# Patient Record
Sex: Female | Born: 1990
Health system: Southern US, Community
[De-identification: ages and names within clinical notes are randomized; demographics above are authoritative.]

## PROBLEM LIST (undated history)

## (undated) DIAGNOSIS — N390 Urinary tract infection, site not specified: Secondary | ICD-10-CM

## (undated) HISTORY — PX: NO PAST SURGERIES: SHX2092

---
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
## 2010-11-27 ENCOUNTER — Encounter: Payer: Self-pay | Admitting: Obstetrics & Gynecology

## 2010-12-27 IMAGING — US US OB DETAIL+14 WK
1 series · 14 of 28 positions shown · non-contrast
Comparison: none

OBSTETRICAL ULTRASOUND:
 This ultrasound was performed in The [HOSPITAL], and the AS OB/GYN report will be stored to [REDACTED] PACS.

[Series 1: us ob detail+14 wk · 0.24mm/px · 73 acquisitions, 14 frames shown]
[im 3/73]
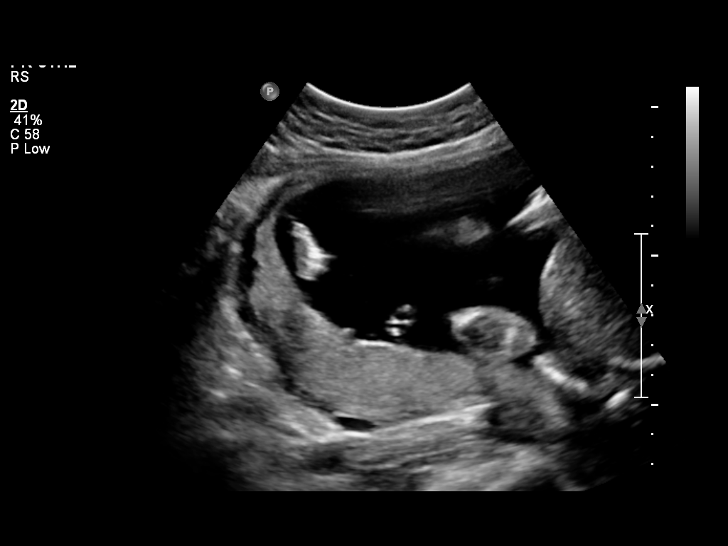
[im 9/73]
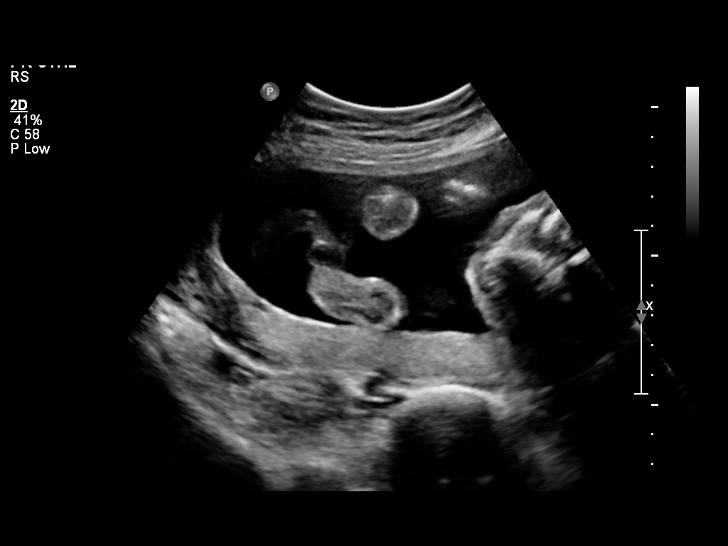
[im 14/73]
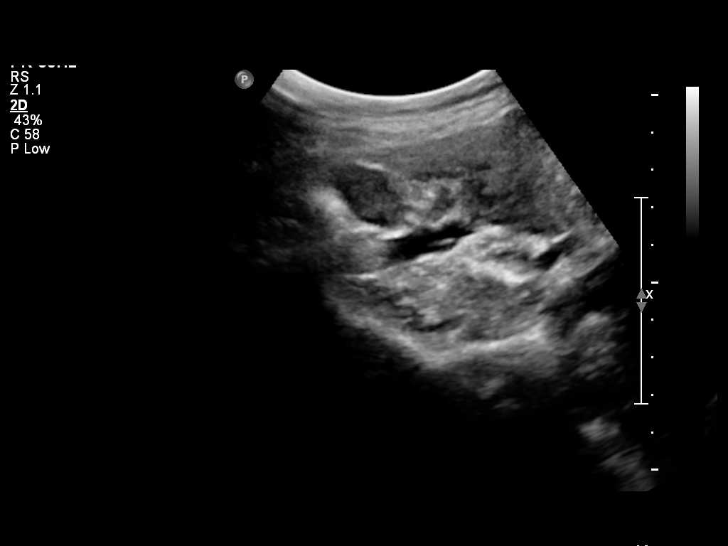
[im 19/73]
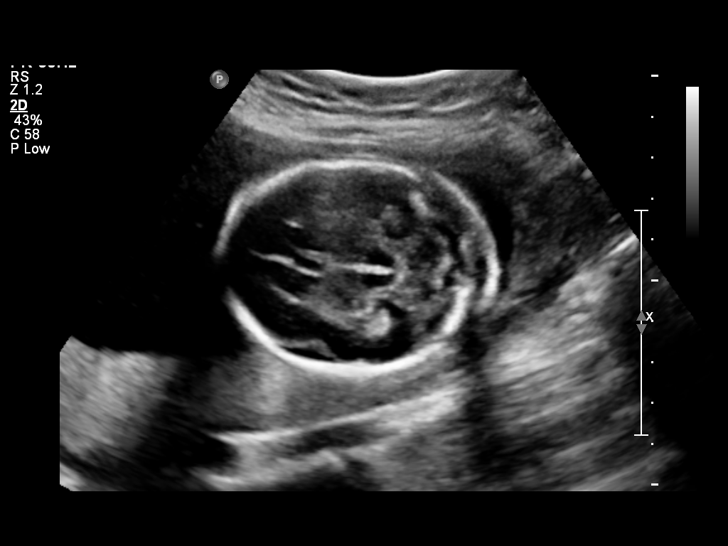
[im 25/73]
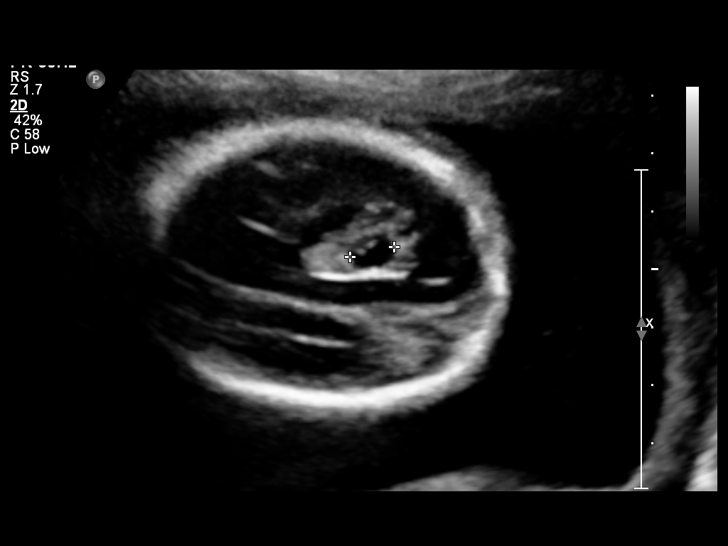
[im 30/73]
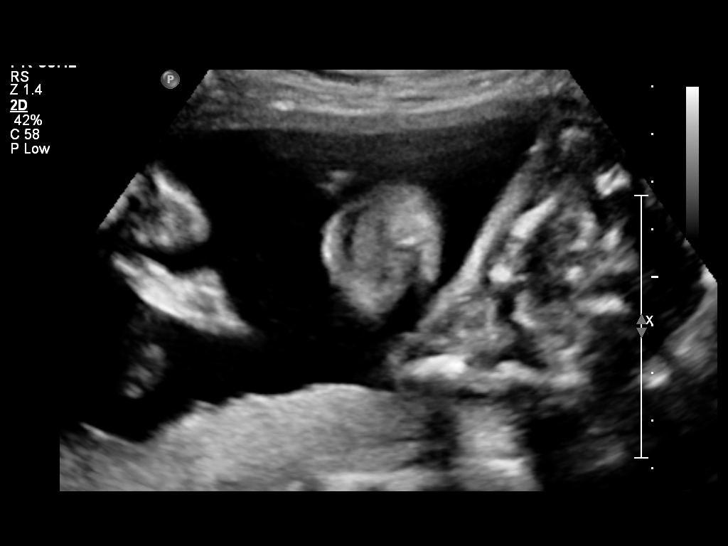
[im 35/73]
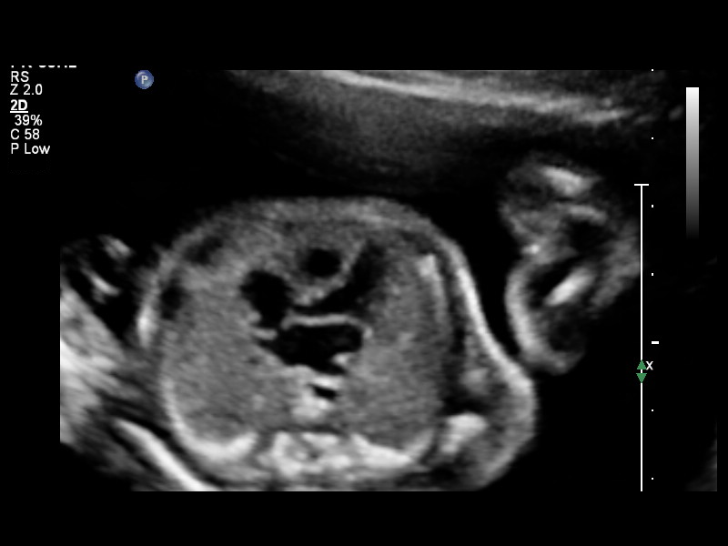
[im 41/73]
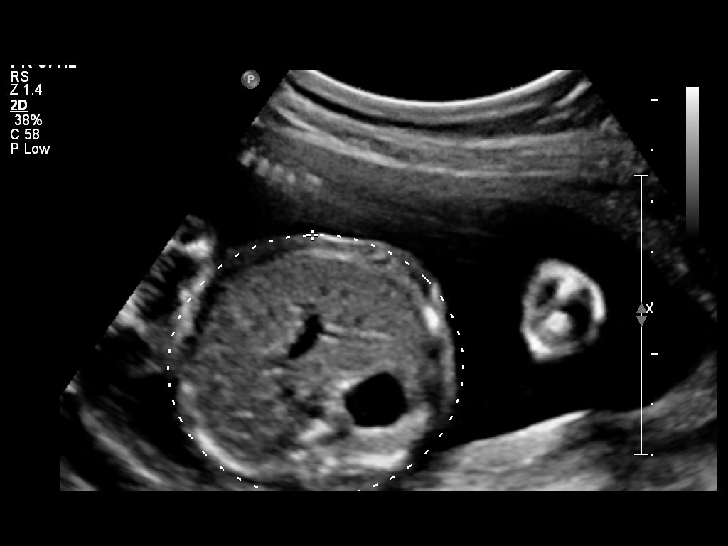
[im 46/73]
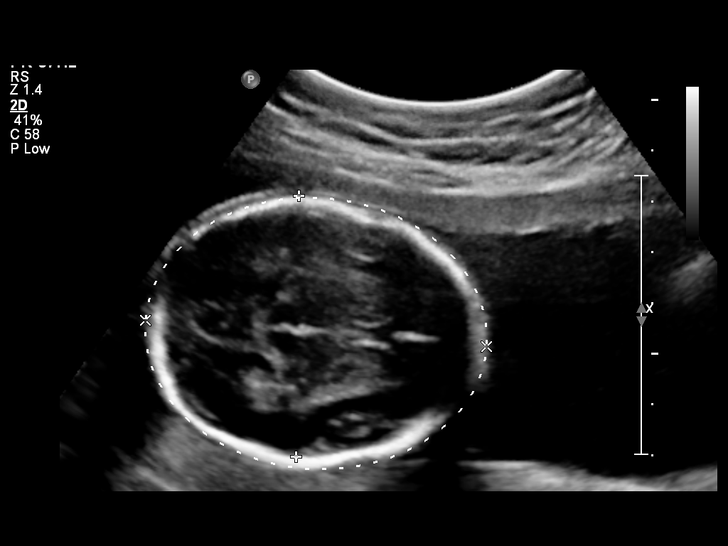
[im 51/73]
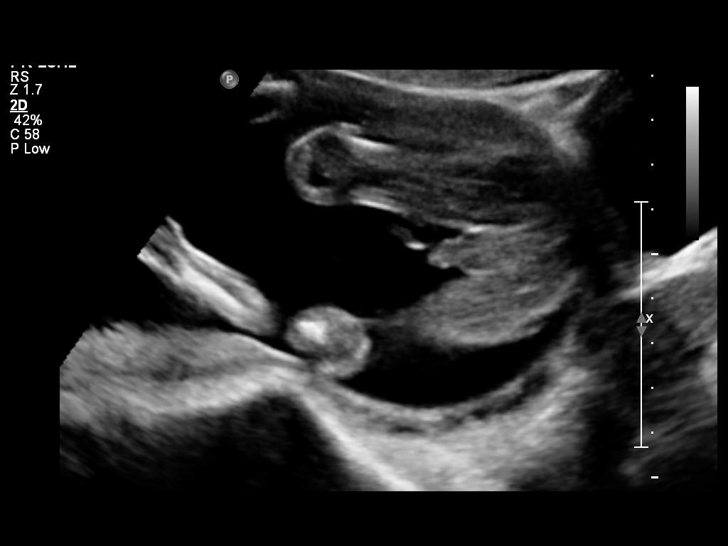
[im 57/73]
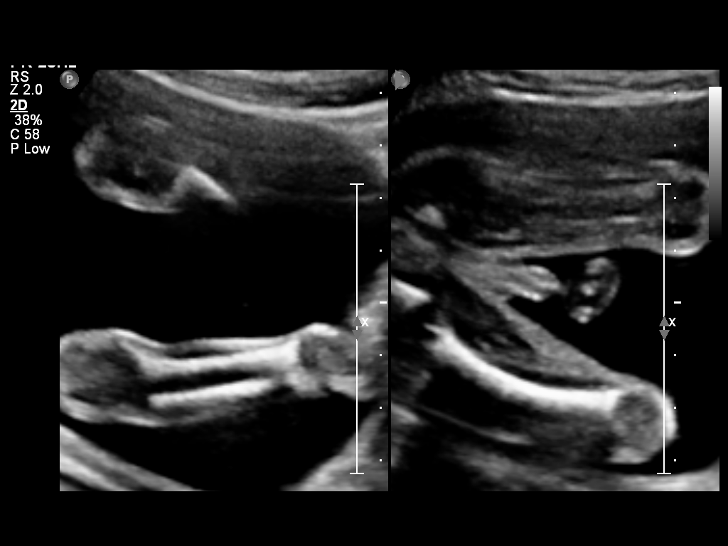
[im 62/73]
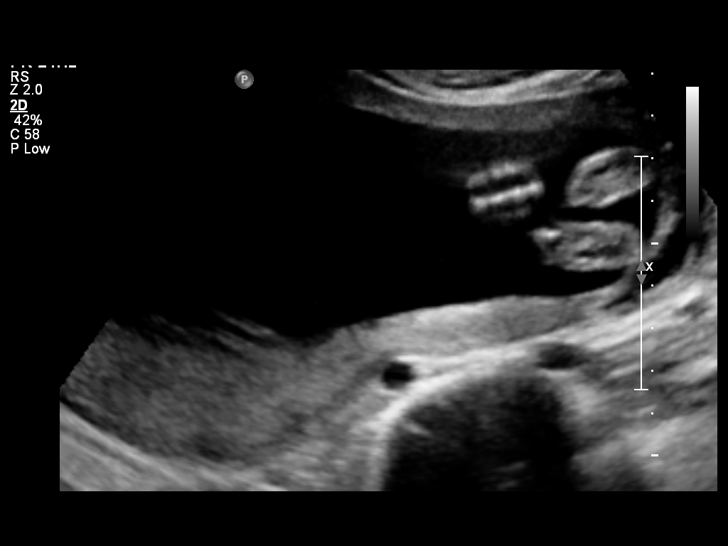
[im 67/73]
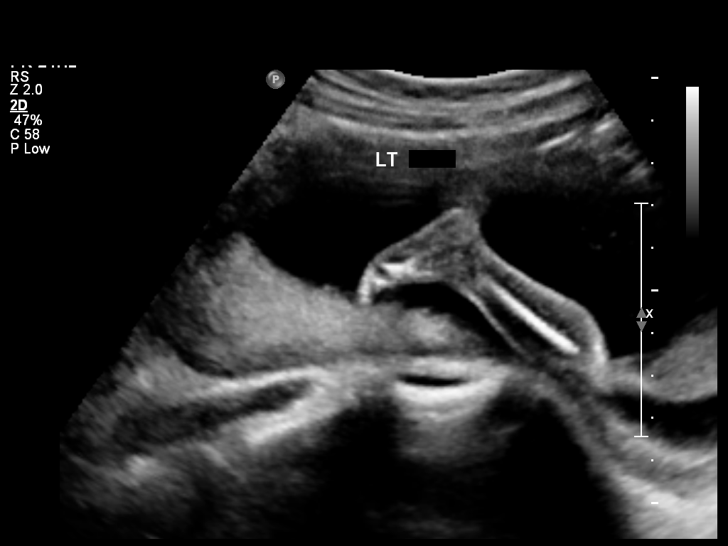
[im 73/73]
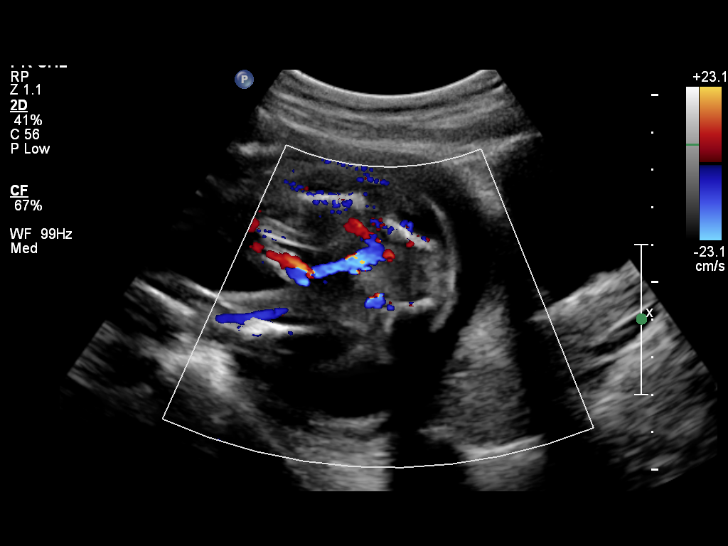

[14 of 28 positions shown; findings below may reference images not displayed]

IMPRESSION: AS OB/GYN has also been faxed to the ordering physician.

## 2011-01-15 IMAGING — US US OB FOLLOW-UP
1 series · 14 of 28 positions shown · non-contrast
Comparison: none

OBSTETRICAL ULTRASOUND:
 This ultrasound exam was performed in the [HOSPITAL] Ultrasound Department.  The OB US report was generated in the AS system, and faxed to the ordering physician.  This report is also available in [HOSPITAL]?s [employer] and in [REDACTED] PACS.

[Series 1: us ob follow up · 0.30mm/px · 35 acquisitions, 14 frames shown]
[im 2/35]
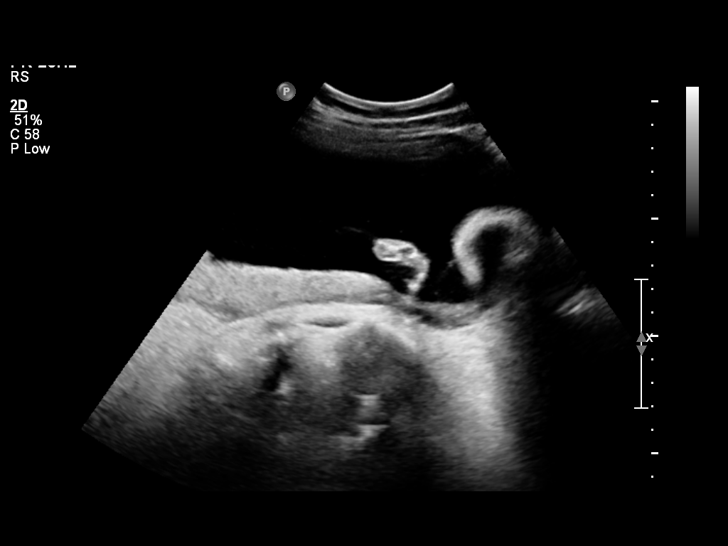
[im 4/35]
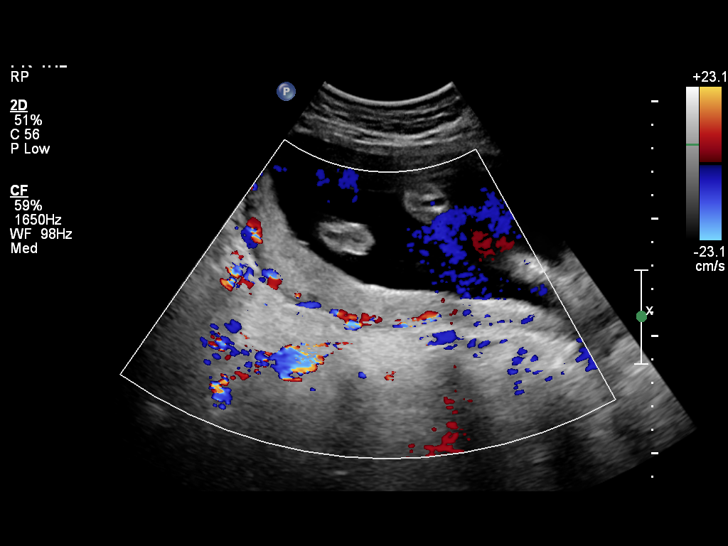
[im 7/35]
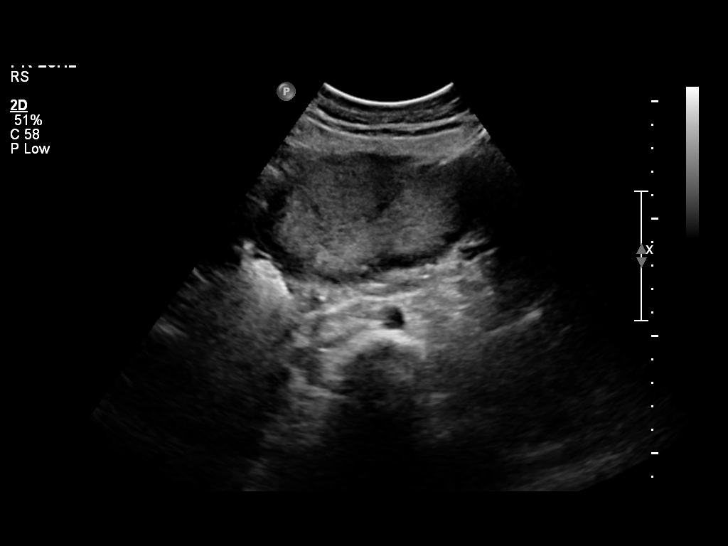
[im 9/35]
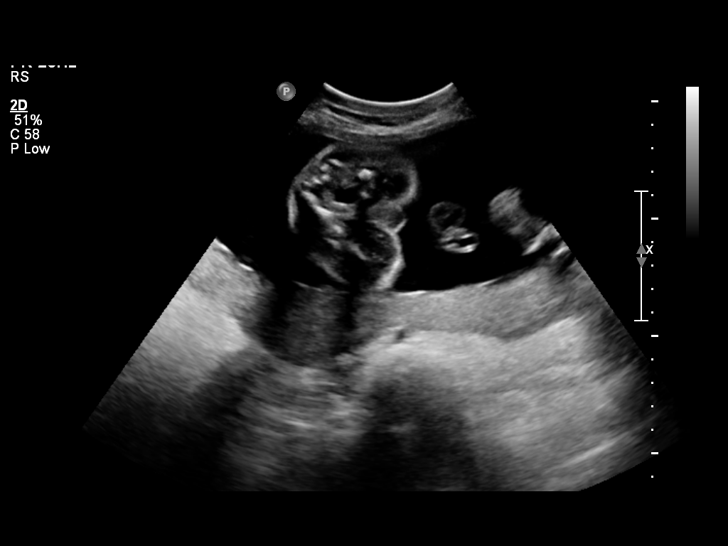
[im 12/35]
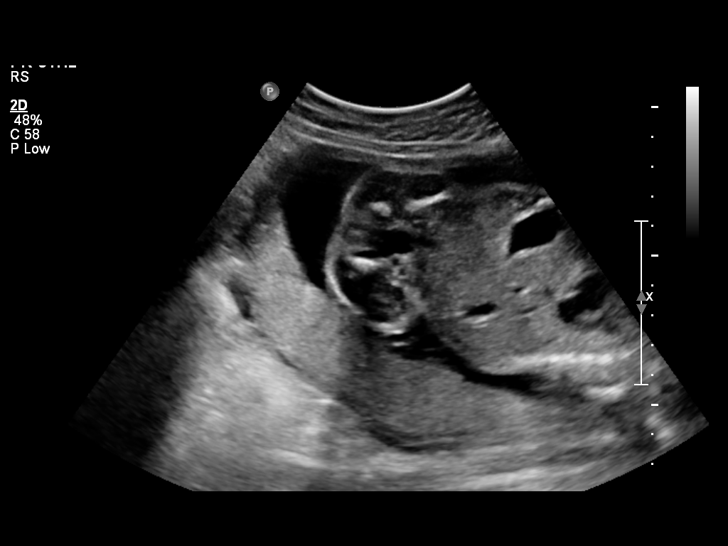
[im 14/35]
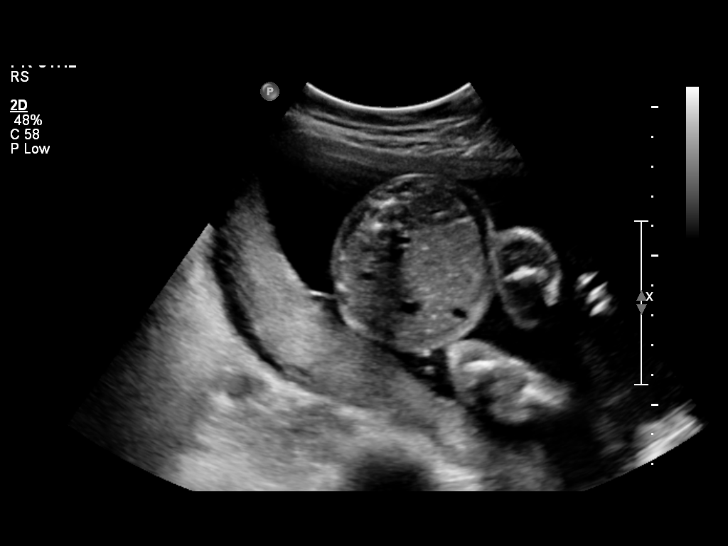
[im 17/35]
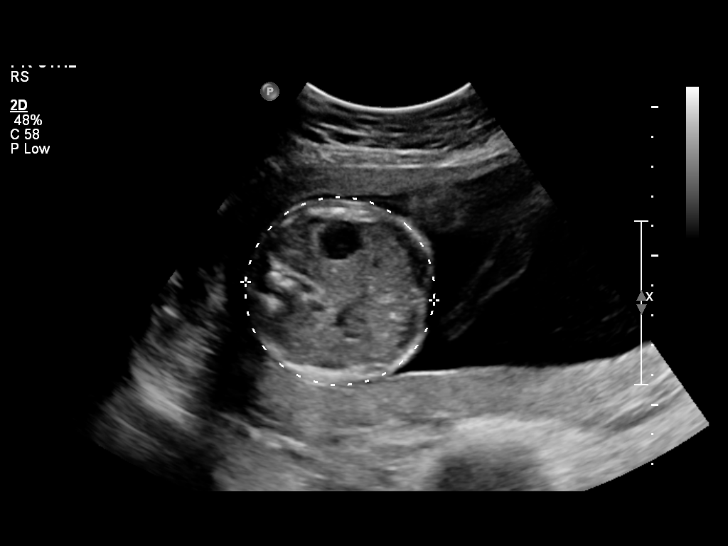
[im 19/35]
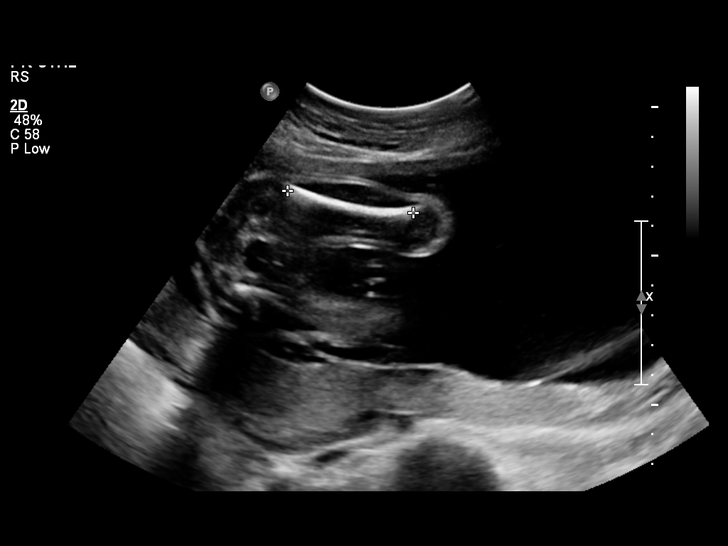
[im 22/35]
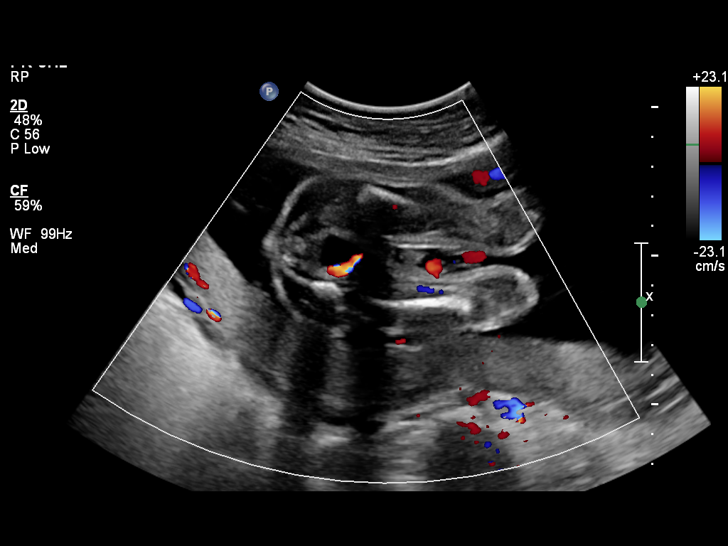
[im 24/35]
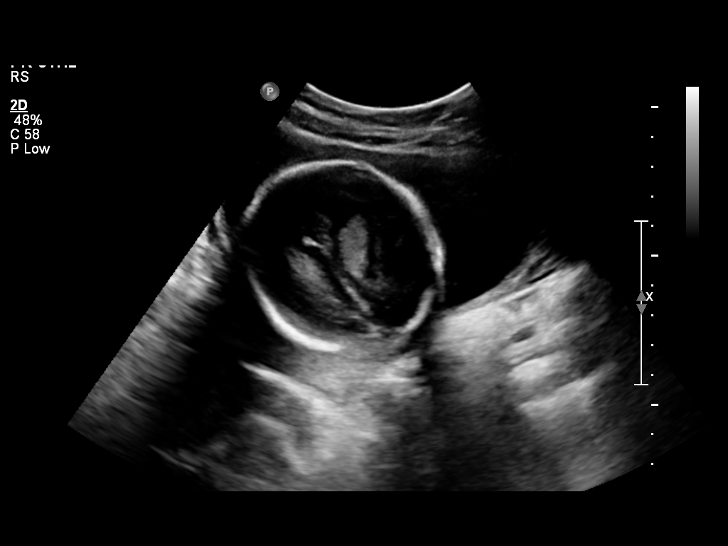
[im 27/35]
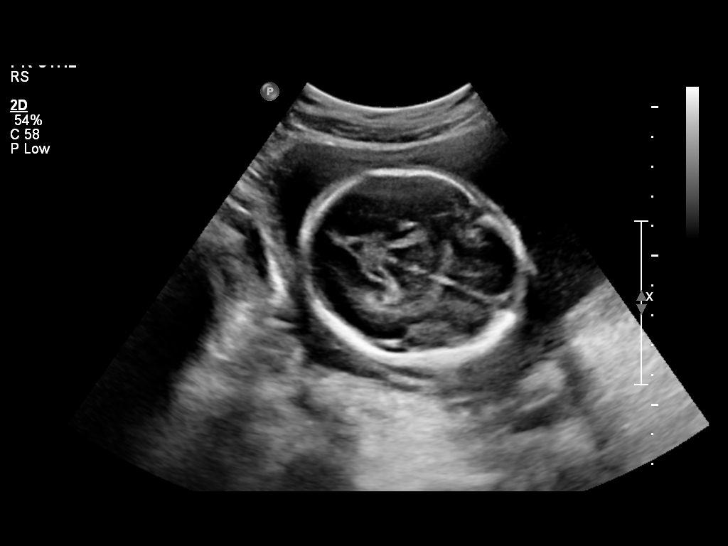
[im 29/35]
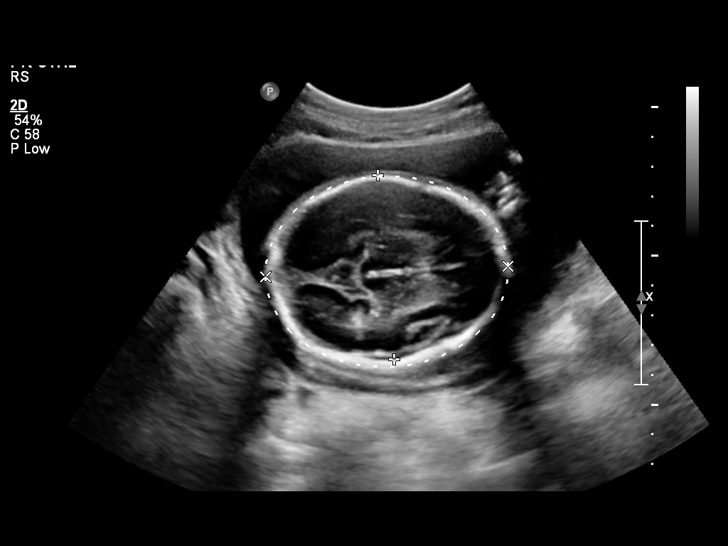
[im 32/35]
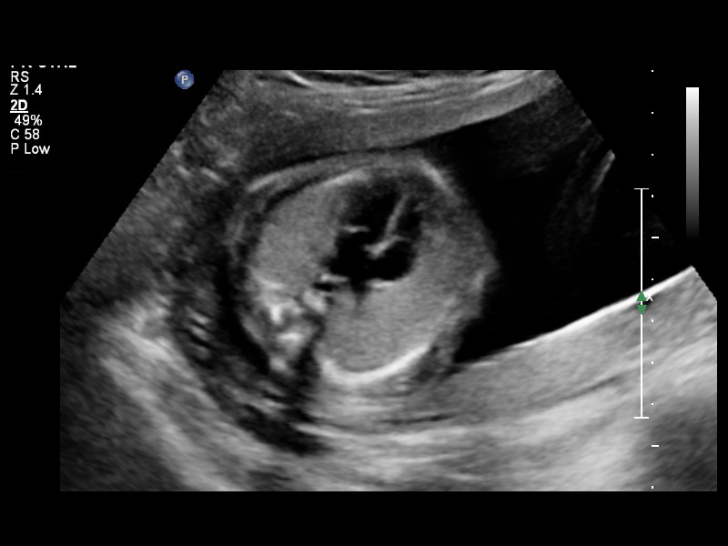
[im 35/35]
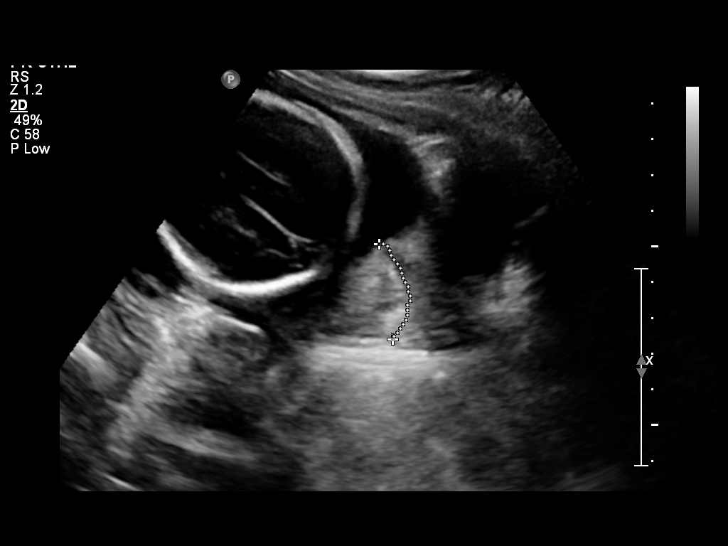

[14 of 28 positions shown; findings below may reference images not displayed]

IMPRESSION: See AS Obstetric US report.

## 2011-01-22 LAB — CBC
Hemoglobin: 11.3 g/dL — ABNORMAL LOW (ref 12.0–15.0)
Hemoglobin: 9.9 g/dL — ABNORMAL LOW (ref 12.0–15.0)
MCHC: 33.5 g/dL (ref 30.0–36.0)
MCV: 84.1 fL (ref 78.0–100.0)
MCV: 84.1 fL (ref 78.0–100.0)
MCV: 84.6 fL (ref 78.0–100.0)
Platelets: 132 10*3/uL — ABNORMAL LOW (ref 150–400)
RBC: 3.39 MIL/uL — ABNORMAL LOW (ref 3.87–5.11)
RBC: 3.51 MIL/uL — ABNORMAL LOW (ref 3.87–5.11)
RDW: 14.8 % (ref 11.5–15.5)
RDW: 15 % (ref 11.5–15.5)
WBC: 13.2 10*3/uL — ABNORMAL HIGH (ref 4.0–10.5)

## 2011-02-09 LAB — URINE CULTURE: Colony Count: NO GROWTH

## 2011-02-09 LAB — URINALYSIS, ROUTINE W REFLEX MICROSCOPIC
Bilirubin Urine: NEGATIVE
Nitrite: NEGATIVE
Specific Gravity, Urine: 1.02 (ref 1.005–1.030)
Urobilinogen, UA: 0.2 mg/dL (ref 0.0–1.0)

## 2011-02-09 LAB — GC/CHLAMYDIA PROBE AMP, GENITAL: GC Probe Amp, Genital: NEGATIVE

## 2011-04-04 ENCOUNTER — Other Ambulatory Visit: Payer: Self-pay

## 2012-01-31 ENCOUNTER — Encounter (HOSPITAL_COMMUNITY): Payer: Self-pay | Admitting: Emergency Medicine

## 2012-01-31 ENCOUNTER — Emergency Department (HOSPITAL_COMMUNITY)
Admission: EM | Admit: 2012-01-31 | Discharge: 2012-02-01 | Disposition: A | Discharge status: Home Health | Payer: BC Managed Care – PPO | Attending: Emergency Medicine | Admitting: Emergency Medicine

## 2012-01-31 ENCOUNTER — Emergency Department (HOSPITAL_COMMUNITY): Payer: BC Managed Care – PPO

## 2012-01-31 DIAGNOSIS — R0602 Shortness of breath: Secondary | ICD-10-CM | POA: Insufficient documentation

## 2012-01-31 DIAGNOSIS — R059 Cough, unspecified: Secondary | ICD-10-CM | POA: Insufficient documentation

## 2012-01-31 DIAGNOSIS — R0789 Other chest pain: Secondary | ICD-10-CM | POA: Insufficient documentation

## 2012-01-31 DIAGNOSIS — R05 Cough: Secondary | ICD-10-CM | POA: Insufficient documentation

## 2012-01-31 DIAGNOSIS — Z79899 Other long term (current) drug therapy: Secondary | ICD-10-CM | POA: Insufficient documentation

## 2012-01-31 DIAGNOSIS — J45901 Unspecified asthma with (acute) exacerbation: Secondary | ICD-10-CM | POA: Insufficient documentation

## 2012-01-31 LAB — CBC
HCT: 42.8 % (ref 36.0–46.0)
Hemoglobin: 14.4 g/dL (ref 12.0–15.0)
MCH: 28.5 pg (ref 26.0–34.0)
MCHC: 33.6 g/dL (ref 30.0–36.0)
MCV: 84.6 fL (ref 78.0–100.0)
Platelets: 233 10*3/uL (ref 150–400)
RBC: 5.06 MIL/uL (ref 3.87–5.11)
RDW: 12.8 % (ref 11.5–15.5)
WBC: 8.1 10*3/uL (ref 4.0–10.5)

## 2012-01-31 LAB — DIFFERENTIAL
Basophils Absolute: 0 10*3/uL (ref 0.0–0.1)
Basophils Relative: 0 % (ref 0–1)
Eosinophils Absolute: 0 10*3/uL (ref 0.0–0.7)
Eosinophils Relative: 0 % (ref 0–5)
Lymphocytes Relative: 12 % (ref 12–46)
Lymphs Abs: 1 10*3/uL (ref 0.7–4.0)
Monocytes Absolute: 0.1 10*3/uL (ref 0.1–1.0)
Monocytes Relative: 1 % — ABNORMAL LOW (ref 3–12)
Neutro Abs: 7 10*3/uL (ref 1.7–7.7)
Neutrophils Relative %: 87 % — ABNORMAL HIGH (ref 43–77)

## 2012-01-31 LAB — POCT I-STAT, CHEM 8
Creatinine, Ser: 0.5 mg/dL (ref 0.50–1.10)
Hemoglobin: 15.6 g/dL — ABNORMAL HIGH (ref 12.0–15.0)
Potassium: 4.6 mEq/L (ref 3.5–5.1)
Sodium: 141 mEq/L (ref 135–145)

## 2012-01-31 MED ORDER — IPRATROPIUM BROMIDE 0.02 % IN SOLN
0.5000 mg | Freq: Once | RESPIRATORY_TRACT | Status: AC
  Administered 2012-01-31: 0.5 mg via RESPIRATORY_TRACT
  Filled 2012-01-31: qty 2.5

## 2012-01-31 MED ORDER — ALBUTEROL SULFATE (5 MG/ML) 0.5% IN NEBU
2.5000 mg | INHALATION_SOLUTION | Freq: Once | RESPIRATORY_TRACT | Status: AC
  Administered 2012-02-01: 2.5 mg via RESPIRATORY_TRACT
  Filled 2012-01-31: qty 0.5

## 2012-01-31 MED ORDER — SODIUM CHLORIDE 0.9 % IV SOLN: Freq: Once | INTRAVENOUS | Status: DC

## 2012-01-31 MED ORDER — ALBUTEROL SULFATE (5 MG/ML) 0.5% IN NEBU
2.5000 mg | INHALATION_SOLUTION | Freq: Once | RESPIRATORY_TRACT | Status: AC
  Administered 2012-01-31: 2.5 mg via RESPIRATORY_TRACT
  Filled 2012-01-31: qty 0.5

## 2012-01-31 NOTE — ED Provider Notes (Signed)
History     CSN: [id number]  Arrival date & time 01/31/12  1941   First MD Initiated Contact with Patient 01/31/12 2123      Chief Complaint  Patient presents with  . Asthma    (Consider location/radiation/quality/duration/timing/severity/associated sxs/prior treatment) HPI Comments: History of asthma has been short of breath.  The last month with, wheezing.  She's been using her inhalers.  She is seeing her primary care physician, who added steroids, and a Z-Pak and change her from albuterol to Combivent 3 days ago without any relief.  She does report that 2 weeks ago.  She had a long car ride to [place] where she sat for the entire length of time.  She denies leg swelling, pain  Patient is a 21 y.o. female presenting with asthma. The history is provided by the patient.  Asthma This is a chronic problem. The current episode started more than 1 month ago. The problem occurs constantly. The problem has been unchanged. Associated symptoms include chest pain and coughing. Pertinent negatives include no chills or fever.    Past Medical History  Diagnosis Date  . Asthma     History reviewed. No pertinent past surgical history.  Family History  Problem Relation Age of Onset  . Hypertension Other   . Diabetes Other     History  Substance Use Topics  . Smoking status: Former Games developer  . Smokeless tobacco: Not on file  . Alcohol Use: No    OB History    Grav Para Term Preterm Abortions TAB SAB Ect Mult Living                  Review of Systems  Constitutional: Negative for fever and chills.  HENT: Negative for rhinorrhea.   Respiratory: Positive for cough, chest tightness, shortness of breath and wheezing.   Cardiovascular: Positive for chest pain. Negative for palpitations.  Neurological: Negative for dizziness.    Allergies  Review of patient's allergies indicates no known allergies.  Home Medications   Current Outpatient Rx  Name Route Sig Dispense Refill  .  ALBUTEROL SULFATE HFA 108 (90 BASE) MCG/ACT IN AERS Inhalation Inhale 2 puffs into the lungs every 6 (six) hours as needed.    Maximino Greenland 18-103 MCG/ACT IN AERO Inhalation Inhale 2 puffs into the lungs every 6 (six) hours as needed.    . AZITHROMYCIN 250 MG PO TABS Oral Take 250 mg by mouth daily.    Marland Kitchen PREDNISONE 20 MG PO TABS Oral Take 60 mg by mouth daily.      BP 121/65  Pulse 79  Temp(Src) 97.5 F (36.4 C) (Oral)  Resp 18  SpO2 99%  LMP 01/11/2012  Physical Exam  Constitutional: She is oriented to person, place, and time. She appears well-developed and well-nourished.  HENT:  Head: Normocephalic.  Eyes: Pupils are equal, round, and reactive to light.  Neck: Normal range of motion.  Cardiovascular: Normal rate.   Pulmonary/Chest: Effort normal. No respiratory distress. She has wheezes. She exhibits no tenderness.  Musculoskeletal: Normal range of motion.  Neurological: She is alert and oriented to person, place, and time.  Skin: Skin is warm.    ED Course  Procedures (including critical care time)   Labs Reviewed  CBC  DIFFERENTIAL  D-DIMER, QUANTITATIVE   Dg Chest 2 View  01/31/2012  *RADIOLOGY REPORT*  Clinical Data: Shortness of breath and cough for 1 week; history of asthma.  CHEST - 2 VIEW  Comparison: Chest radiograph performed 08/22/2009  Findings: The lungs are well-aerated and clear.  There is no evidence of focal opacification, pleural effusion or pneumothorax.  The heart is normal in size; the mediastinal contour is within normal limits.  No acute osseous abnormalities are seen.  IMPRESSION: No acute cardiopulmonary process seen.  Original Report Authenticated By: Tonia Ghent, M.D.     No diagnosis found.  Decreased wheezing, moving more air after second albuterol treatment  MDM  Suspect this is asthma exacerbation, but will obtain d-dimer with patient's history of chest pain, persistent shortness of breath for the past month since a long car  trip        Arman Filter, NP 02/01/12 0032

## 2012-01-31 NOTE — ED Notes (Signed)
Pt states she is having pain in her chest with breathing and having random asthma attacks for no reason  Pt states she is having to use her inhaler too much  Pt went to her PCP on Monday and they gave her a different inhaler, Combivent, a zpack, and prednisone  They called the dr office today and was told to come to the hospital for a chest xray to rule out pneumonia

## 2012-02-01 MED ORDER — ALBUTEROL SULFATE (2.5 MG/3ML) 0.083% IN NEBU: 2.5000 mg | INHALATION_SOLUTION | Freq: Four times a day (QID) | RESPIRATORY_TRACT | Status: DC | PRN

## 2012-02-01 NOTE — ED Provider Notes (Signed)
Medical screening examination/treatment/procedure(s) were performed by non-physician practitioner and as supervising physician I was immediately available for consultation/collaboration.    Conley Pawling L Clerance Umland, MD 02/01/12 2348 

## 2012-02-01 NOTE — Discharge Instructions (Signed)
Asthma, Adult Asthma is caused by narrowing of the air passages in the lungs. It may be triggered by pollen, dust, animal dander, molds, some foods, respiratory infections, exposure to smoke, exercise, emotional stress or other allergens (things that cause allergic reactions or allergies). Repeat attacks are common. HOME CARE INSTRUCTIONS   Use prescription medications as ordered by your caregiver.   Avoid pollen, dust, animal dander, molds, smoke and other things that cause attacks at home and at work.   You may have fewer attacks if you decrease dust in your home. Electrostatic air cleaners may help.   It may help to replace your pillows or mattress with materials less likely to cause allergies.   Talk to your caregiver about an action plan for managing asthma attacks at home, including, the use of a peak flow meter which measures the severity of your asthma attack. An action plan can help minimize or stop the attack without having to seek medical care.   If you are not on a fluid restriction, drink 8 to 10 glasses of water each day.   Always have a plan prepared for seeking medical attention, including, calling your physician, accessing local emergency care, and calling 911 (in the [place] for a severe attack.   Discuss possible exercise routines with your caregiver.   If animal dander is the cause of asthma, you may need to get rid of pets.  SEEK MEDICAL CARE IF:   You have wheezing and shortness of breath even if taking medicine to prevent attacks.   You have muscle aches, chest pain or thickening of sputum.   Your sputum changes from clear or white to yellow, green, gray, or bloody.   You have any problems that may be related to the medicine you are taking (such as a rash, itching, swelling or trouble breathing).  SEEK IMMEDIATE MEDICAL CARE IF:   Your usual medicines do not stop your wheezing or there is increased coughing and/or shortness of breath.   You have increased  difficulty breathing.   You have a fever.  MAKE SURE YOU:   Understand these instructions.   Will watch your condition.   Will get help right away if you are not doing well or get worse.  Document Released: 10/23/2005 Document Revised: 10/12/2011 Document Reviewed: 06/10/2008 [facility] Patient Information 2012 [place], [place]. As discussed there is no pneumonia, and chest x-ray tonight.  Also, please use your albuterol nebulizer in your home machine every 6 hours for the next 2 days while awake as well as her Combivent any steroids in your azithromycin and then use as needed

## 2012-02-22 ENCOUNTER — Other Ambulatory Visit: Payer: Self-pay | Admitting: Family Medicine

## 2012-02-22 DIAGNOSIS — N63 Unspecified lump in unspecified breast: Secondary | ICD-10-CM

## 2012-02-26 ENCOUNTER — Ambulatory Visit
Admission: RE | Admit: 2012-02-26 | Discharge: 2012-02-26 | Disposition: A | Discharge status: Home / Self Care | Payer: BC Managed Care – PPO | Source: Ambulatory Visit | Attending: Family Medicine | Admitting: Family Medicine

## 2012-02-26 DIAGNOSIS — N63 Unspecified lump in unspecified breast: Secondary | ICD-10-CM

## 2012-06-04 LAB — OB RESULTS CONSOLE HIV ANTIBODY (ROUTINE TESTING): HIV: NONREACTIVE

## 2012-06-04 LAB — OB RESULTS CONSOLE RUBELLA ANTIBODY, IGM: Rubella: IMMUNE

## 2012-06-04 LAB — OB RESULTS CONSOLE HEPATITIS B SURFACE ANTIGEN: Hepatitis B Surface Ag: NEGATIVE

## 2012-06-04 LAB — OB RESULTS CONSOLE RPR: RPR: NONREACTIVE

## 2012-10-30 ENCOUNTER — Encounter (HOSPITAL_COMMUNITY): Payer: Self-pay | Admitting: Nurse Practitioner

## 2012-10-30 ENCOUNTER — Emergency Department (HOSPITAL_COMMUNITY)
Admission: EM | Admit: 2012-10-30 | Discharge: 2012-10-30 | Disposition: A | Discharge status: Home / Self Care | Payer: Medicaid Other | Attending: Emergency Medicine | Admitting: Emergency Medicine

## 2012-10-30 DIAGNOSIS — Z79899 Other long term (current) drug therapy: Secondary | ICD-10-CM | POA: Insufficient documentation

## 2012-10-30 DIAGNOSIS — R109 Unspecified abdominal pain: Secondary | ICD-10-CM | POA: Insufficient documentation

## 2012-10-30 DIAGNOSIS — Z87891 Personal history of nicotine dependence: Secondary | ICD-10-CM | POA: Insufficient documentation

## 2012-10-30 DIAGNOSIS — R141 Gas pain: Secondary | ICD-10-CM | POA: Insufficient documentation

## 2012-10-30 DIAGNOSIS — J45909 Unspecified asthma, uncomplicated: Secondary | ICD-10-CM | POA: Insufficient documentation

## 2012-10-30 DIAGNOSIS — R1110 Vomiting, unspecified: Secondary | ICD-10-CM

## 2012-10-30 DIAGNOSIS — O212 Late vomiting of pregnancy: Secondary | ICD-10-CM | POA: Insufficient documentation

## 2012-10-30 DIAGNOSIS — R142 Eructation: Secondary | ICD-10-CM | POA: Insufficient documentation

## 2012-10-30 DIAGNOSIS — R197 Diarrhea, unspecified: Secondary | ICD-10-CM | POA: Insufficient documentation

## 2012-10-30 MED ORDER — ONDANSETRON HCL 4 MG/2ML IJ SOLN
4.0000 mg | Freq: Once | INTRAMUSCULAR | Status: AC
  Administered 2012-10-30: 4 mg via INTRAVENOUS
  Filled 2012-10-30: qty 2

## 2012-10-30 MED ORDER — SODIUM CHLORIDE 0.9 % IV BOLUS (SEPSIS)
1000.0000 mL | Freq: Once | INTRAVENOUS | Status: AC
  Administered 2012-10-30: 1000 mL via INTRAVENOUS

## 2012-10-30 MED ORDER — ONDANSETRON HCL 4 MG PO TABS: 8.0000 mg | ORAL_TABLET | Freq: Three times a day (TID) | ORAL | Status: DC | PRN

## 2012-10-30 MED ORDER — SODIUM CHLORIDE 0.9 % IV SOLN
Freq: Once | INTRAVENOUS | Status: AC
  Administered 2012-10-30: 02:00:00 via INTRAVENOUS

## 2012-10-30 MED ORDER — ONDANSETRON HCL 4 MG/2ML IJ SOLN: 4.0000 mg | Freq: Once | INTRAMUSCULAR | Status: DC

## 2012-10-30 NOTE — ED Provider Notes (Signed)
History     CSN: [id number]  Arrival date & time 10/30/12  0052   First MD Initiated Contact with Patient 10/30/12 0105      Chief Complaint  Patient presents with  . Emesis During Pregnancy    (Consider location/radiation/quality/duration/timing/severity/associated sxs/prior treatment) HPI Patient developed abdominal cramping at the lower abdomen felt with possible labor pains onset 11 PM tonight accompanied by one episode of vomiting and 2 episodes of diarrhea. No treatment prior to coming here. Presently feels mild vague lower abdominal discomfort. She denies gush of fluid between her legs. No treatment prior to coming here nothing makes symptoms better or worse  Past Medical History  Diagnosis Date  . Asthma     No past surgical history on file.  Family History  Problem Relation Age of Onset  . Hypertension Other   . Diabetes Other     History  Substance Use Topics  . Smoking status: Former Games developer  . Smokeless tobacco: Not on file  . Alcohol Use: No    OB History    Grav Para Term Preterm Abortions TAB SAB Ect Mult Living                  Review of Systems  Constitutional: Negative.   HENT: Negative.   Respiratory: Negative.   Cardiovascular: Negative.   Gastrointestinal: Positive for nausea, vomiting, abdominal pain, diarrhea and abdominal distention.  Genitourinary:       [redacted] weeks pregnant  Musculoskeletal: Negative.   Skin: Negative.   Neurological: Negative.   Hematological: Negative.   Psychiatric/Behavioral: Negative.   All other systems reviewed and are negative.    Allergies  Review of patient's allergies indicates no known allergies.  Home Medications   Current Outpatient Rx  Name  Route  Sig  Dispense  Refill  . ALBUTEROL SULFATE HFA 108 (90 BASE) MCG/ACT IN AERS   Inhalation   Inhale 2 puffs into the lungs every 6 (six) hours as needed.         . ALBUTEROL SULFATE (2.5 MG/3ML) 0.083% IN NEBU   Nebulization   Take 3 mLs (2.5 mg  total) by nebulization every 6 (six) hours as needed for wheezing.   75 mL   12   . IPRATROPIUM-ALBUTEROL 18-103 MCG/ACT IN AERO   Inhalation   Inhale 2 puffs into the lungs every 6 (six) hours as needed.         . AZITHROMYCIN 250 MG PO TABS   Oral   Take 250 mg by mouth daily.         Marland Kitchen PREDNISONE 20 MG PO TABS   Oral   Take 60 mg by mouth daily.           BP 123/65  Temp 98.2 F (36.8 C) (Oral)  Resp 16  SpO2 100%  Physical Exam  Nursing note and vitals reviewed. Constitutional: She appears well-developed and well-nourished.  HENT:  Head: Normocephalic and atraumatic.  Eyes: Conjunctivae normal are normal. Pupils are equal, round, and reactive to light.  Neck: Neck supple. No tracheal deviation present. No thyromegaly present.  Cardiovascular: Normal rate and regular rhythm.   No murmur heard. Pulmonary/Chest: Effort normal and breath sounds normal.  Abdominal: Soft. Bowel sounds are normal. She exhibits no distension. There is no tenderness.       Minimal diffuse tenderness, gravd fetal heart tones 140s  Musculoskeletal: Normal range of motion. She exhibits no edema and no tenderness.  Neurological: She is alert. Coordination normal.  Skin: Skin is warm and dry. No rash noted.  Psychiatric: She has a normal mood and affect.    ED Course  Procedures (including critical care time)  to  called patient placed on fetal monitor Labs Reviewed - No data to display No results found.   No diagnosis found.   3:45 AM feels improved after treatment with intravenous fluids and Zofran. Able to drink without difficulty. No further vomiting. Patient was monitored for several hours here on the fetal monitor initially had some mild contractions which resolved after treatment with IV hydration. Dr. Arlyce Dice this consult via phone by OB rapid response nurse MDM  Plan prescription Zofran [facility] if symptoms worsen or call Dr.  Arlyce Dice Diagnosis #1 vomiting and diarrhea #2 abdominal pain #3 uterine contractions-resolved        Doug Sou, MD 10/30/12 [phone]

## 2012-10-30 NOTE — ED Notes (Addendum)
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 Pt sts having diarrhea. Pt sts feeling malaise this evening. Pt sts feeling lots of pressure in mid abdomen

## 2012-10-30 NOTE — Progress Notes (Signed)
Advised Dr Kirtland Bouchard. Ross of pt at [facility] ED, SVE 1/40/-3 soft, FHT 130, reactive, no decels, UC 3-6, 40-60, mild.  Dr Tenny Craw requested monitoring x1 hr, recheck cervix then.  If cervix unchanged and pt feeling better may discharge home.  Advised Dr. Ethelda Chick of discussion w/Dr. Tenny Craw; he is agreeable.   Clement Sayres OB Rapid Response RN

## 2012-10-30 NOTE — ED Notes (Signed)
Pt A.O. X 2. NAD. Denies pain. Verbalized understanding of discharge instructions. Verbalized understanding of indicators for the need to seek further treatment. Verbalized understanding of medication administration. No further questions at this time. Mother at bedside.

## 2012-11-06 NOTE — L&D Delivery Note (Signed)
Patient progressed rapidly, midwife standby in room as baby delivered spontaneously, pt pushed for <10 minutes with epidural.   NSVD female infant, Apgars 9/9, weight pending.   The patient had no lacerations. Fundus was firm. EBL was expected. Placenta was delivered intact. Vagina was clear.  Baby was vigorous to bedside.  Stacy Oneill

## 2012-11-11 ENCOUNTER — Other Ambulatory Visit: Payer: Self-pay

## 2012-11-11 LAB — OB RESULTS CONSOLE GBS: GBS: NEGATIVE

## 2012-11-18 ENCOUNTER — Inpatient Hospital Stay (HOSPITAL_COMMUNITY)
Admission: AD | Admit: 2012-11-18 | Discharge: 2012-11-18 | Disposition: A | Discharge status: Hospice | Payer: Medicaid Other | Source: Ambulatory Visit | Attending: Obstetrics and Gynecology | Admitting: Obstetrics and Gynecology

## 2012-11-18 ENCOUNTER — Encounter (HOSPITAL_COMMUNITY): Payer: Self-pay | Admitting: *Deleted

## 2012-11-18 DIAGNOSIS — R002 Palpitations: Secondary | ICD-10-CM

## 2012-11-18 DIAGNOSIS — D689 Coagulation defect, unspecified: Secondary | ICD-10-CM | POA: Insufficient documentation

## 2012-11-18 DIAGNOSIS — D509 Iron deficiency anemia, unspecified: Secondary | ICD-10-CM

## 2012-11-18 DIAGNOSIS — J45909 Unspecified asthma, uncomplicated: Secondary | ICD-10-CM

## 2012-11-18 DIAGNOSIS — O99019 Anemia complicating pregnancy, unspecified trimester: Secondary | ICD-10-CM | POA: Insufficient documentation

## 2012-11-18 DIAGNOSIS — O99891 Other specified diseases and conditions complicating pregnancy: Secondary | ICD-10-CM | POA: Insufficient documentation

## 2012-11-18 DIAGNOSIS — D696 Thrombocytopenia, unspecified: Secondary | ICD-10-CM | POA: Insufficient documentation

## 2012-11-18 HISTORY — DX: Urinary tract infection, site not specified: N39.0

## 2012-11-18 LAB — BASIC METABOLIC PANEL
CO2: 23 mEq/L (ref 19–32)
Chloride: 100 mEq/L (ref 96–112)
Glucose, Bld: 81 mg/dL (ref 70–99)
Potassium: 3.6 mEq/L (ref 3.5–5.1)
Sodium: 133 mEq/L — ABNORMAL LOW (ref 135–145)

## 2012-11-18 LAB — CBC
Hemoglobin: 10.2 g/dL — ABNORMAL LOW (ref 12.0–15.0)
MCH: 27.1 pg (ref 26.0–34.0)
RBC: 3.77 MIL/uL — ABNORMAL LOW (ref 3.87–5.11)
WBC: 9 10*3/uL (ref 4.0–10.5)

## 2012-11-18 NOTE — MAU Note (Signed)
Pt sent from office, - irregular heartbeat(her's NOT the baby). No hx.  No complaints.

## 2012-11-18 NOTE — MAU Provider Note (Signed)
Chief Complaint:  Irregular Heart Beat   First Provider Initiated Contact with Patient 11/18/12 1636      HPI: Stacy Oneill is a 22 y.o. G2P1001 at [redacted]w[redacted]d who is sent by Stacy Oneill to maternity admissions for further evaluation after her routine PN visit. Reports onset last night of sensation of heart pounding and feeling SOB and a little lightheaded. It lasted several  seconds, but has continued today every few minutes. Sometimes feels like heart is beating fast, but not fluttering or pounding in neck. Unaware of ever skipping beats. Uses albuterol daily (including yesterday and today) same as she always has. Caffeine use: husband says 3 sodas a day, pt says not so much. Denies contractions, leakage of fluid or vaginal bleeding. Good fetal movement.   ROS negative for CP, persistent dizziness, diaphoresis, illicit drugs, anxiety  Pregnancy Course: significant for mild anemia, chronic asthma. PMD Stacy Oneill  Past Medical History: Past Medical History  Diagnosis Date  . Asthma   . Urinary tract infection   Neg hx syncope, palpitations, CP, exercise intolerance  Past obstetric history: OB History    Grav Para Term Preterm Abortions TAB SAB Ect Mult Living   2 1 1       1      # Outc Date GA Lbr Len/2nd Wgt Sex Del Anes PTL Lv   1 TRM     M SVD EPI No Yes   2 CUR               Past Surgical History: Past Surgical History  Procedure Date  . No past surgeries     Family History: Family History  Problem Relation Age of Onset  . Hypertension Other   . Diabetes Other   . Other Neg Hx     Social History: History  Substance Use Topics  . Smoking status: Former Smoker    Types: Cigarettes  . Smokeless tobacco: Never Used     Comment: quit prior to preg  . Alcohol Use: No    Allergies: No Known Allergies  Meds:  Prescriptions prior to admission  Medication Sig Dispense Refill  . albuterol (PROVENTIL HFA;VENTOLIN HFA) 108 (90 BASE) MCG/ACT inhaler Inhale 2 puffs  into the lungs every 6 (six) hours as needed. For shortness of breath      . albuterol (PROVENTIL) (2.5 MG/3ML) 0.083% nebulizer solution Take 3 mLs (2.5 mg total) by nebulization every 6 (six) hours as needed for wheezing.  75 mL  12  . ondansetron (ZOFRAN) 4 MG tablet Take 2 tablets (8 mg total) by mouth every 8 (eight) hours as needed for nausea.  16 tablet  0    ROS: Pertinent findings in history of present illness.  Physical Exam  Blood pressure 111/67, pulse 95, temperature 98.3 F (36.8 C), temperature source Oral, resp. rate 18, last menstrual period 01/11/2012. GENERAL: Well-developed, well-nourished female in no acute distress.  HEENT: normocephalic HEART:RRR with gr 1 sytolic murmur @ LSB, no gallop or rub heard RESP: normal effort, CTA bilaterally ABDOMEN: Soft, non-tender, gravid appropriate for gestational age EXTREMITIES: Nontender, tr dep edema NEURO: alert and oriented, nonfocal     FHT:  Baseline 130 , moderate variability, accelerations present, no decelerations Contractions: UI   Labs: Results for orders placed during the hospital encounter of 11/18/12 (from the past 24 hour(s))  CBC     Status: Abnormal   Collection Time   11/18/12  5:37 PM      Component Value Range  WBC 9.0  4.0 - 10.5 K/uL   RBC 3.77 (*) 3.87 - 5.11 MIL/uL   Hemoglobin 10.2 (*) 12.0 - 15.0 g/dL   HCT 16.1 (*) 09.6 - 04.5 %   MCV 83.6  78.0 - 100.0 fL   MCH 27.1  26.0 - 34.0 pg   MCHC 32.4  30.0 - 36.0 g/dL   RDW 40.9  81.1 - 91.4 %   Platelets 120 (*) 150 - 400 K/uL  BASIC METABOLIC PANEL     Status: Abnormal   Collection Time   11/18/12  5:37 PM      Component Value Range   Sodium 133 (*) 135 - 145 mEq/L   Potassium 3.6  3.5 - 5.1 mEq/L   Chloride 100  96 - 112 mEq/L   CO2 23  19 - 32 mEq/L   Glucose, Bld 81  70 - 99 mg/dL   BUN 5 (*) 6 - 23 mg/dL   Creatinine, Ser 7.82 (*) 0.50 - 1.10 mg/dL   Calcium 8.7  8.4 - 95.6 mg/dL   GFR calc non Af Amer >90  >90 mL/min   GFR calc Af  Amer >90  >90 mL/min    Imaging:  No results found.  MAU Course: ECG normal: NSR at 75bpm TSH pending Had no episodes of palpitations while in MAU  Assessment: 1. Heart palpitations   2. Anemia, iron deficiency   3. Thrombocytopenia   4. Chronic asthma   22 yo G2P1001 at [redacted]w[redacted]d Borderline anemia and thrombocytopenia   Plan: Consult: Stacy Oneill defers to Stacy Oneill at [facility] with whom I discussed the case. Will give pt. contact information for [facility] to make appointment for ambulatory monitoring if symptoms continue. Advised to stop all caffeine, take PNV w/ iron, use albuterol judiciously Discharge home AVS on Palpitations Labor precautions and fetal kick counts  Follow-up Information    Follow up with Stacy Baas, MD. (Keep your scheduled appointment with Stacy Oneill. Call [facility] at [phone] if symptoms worsen)    Contact information:   [address] [place] [address] [phone]           Medication List     As of 11/18/2012  6:59 PM    TAKE these medications         albuterol 108 (90 BASE) MCG/ACT inhaler   Commonly known as: PROVENTIL HFA;VENTOLIN HFA   Inhale 2 puffs into the lungs every 6 (six) hours as needed. For shortness of breath      albuterol (2.5 MG/3ML) 0.083% nebulizer solution   Commonly known as: PROVENTIL   Take 3 mLs (2.5 mg total) by nebulization every 6 (six) hours as needed for wheezing.      ondansetron 4 MG tablet   Commonly known as: ZOFRAN   Take 2 tablets (8 mg total) by mouth every 8 (eight) hours as needed for nausea.         Stacy Oneill, CNM 11/18/2012 5:23 PM

## 2012-11-26 ENCOUNTER — Inpatient Hospital Stay (HOSPITAL_COMMUNITY)
Admission: AD | Admit: 2012-11-26 | Discharge: 2012-11-26 | Disposition: A | Discharge status: Home / Self Care | Payer: Medicaid Other | Source: Ambulatory Visit | Attending: Obstetrics and Gynecology | Admitting: Obstetrics and Gynecology

## 2012-11-26 ENCOUNTER — Encounter (HOSPITAL_COMMUNITY): Payer: Self-pay | Admitting: *Deleted

## 2012-11-26 DIAGNOSIS — O479 False labor, unspecified: Secondary | ICD-10-CM | POA: Insufficient documentation

## 2012-11-26 NOTE — MAU Note (Signed)
Erven Colla, CN on BS notified of patient status. Patient will be triaged in room 168 due to bed availability in MAU.

## 2012-11-26 NOTE — MAU Note (Signed)
Patient states she is having frequent contractions that start in the back and radiate to the front. Denies any bleeding or leaking and reports good fetal movement.

## 2012-11-26 NOTE — Progress Notes (Signed)
Pt given discharge instructions, pt verbalizes understanding. Pt to keep scheduled appt on Thursday 11/28/12.

## 2012-12-06 ENCOUNTER — Inpatient Hospital Stay (HOSPITAL_COMMUNITY)
Admission: AD | Admit: 2012-12-06 | Discharge: 2012-12-07 | DRG: 775 | Disposition: A | Discharge status: Home / Self Care | Payer: Medicaid Other | Source: Ambulatory Visit | Attending: Obstetrics and Gynecology | Admitting: Obstetrics and Gynecology

## 2012-12-06 ENCOUNTER — Encounter (HOSPITAL_COMMUNITY): Payer: Self-pay | Admitting: *Deleted

## 2012-12-06 LAB — CBC
HCT: 37.6 % (ref 36.0–46.0)
RDW: 14.2 % (ref 11.5–15.5)
WBC: 12.4 10*3/uL — ABNORMAL HIGH (ref 4.0–10.5)

## 2012-12-06 MED ORDER — BENZOCAINE-MENTHOL 20-0.5 % EX AERO
1.0000 "application " | INHALATION_SPRAY | CUTANEOUS | Status: DC | PRN
  Administered 2012-12-06 – 2012-12-07 (×2): 1 via TOPICAL
  Filled 2012-12-06 (×2): qty 56

## 2012-12-06 MED ORDER — LACTATED RINGERS IV SOLN: 500.0000 mL | INTRAVENOUS | Status: DC | PRN

## 2012-12-06 MED ORDER — LANOLIN HYDROUS EX OINT: TOPICAL_OINTMENT | CUTANEOUS | Status: DC | PRN

## 2012-12-06 MED ORDER — SENNOSIDES-DOCUSATE SODIUM 8.6-50 MG PO TABS
2.0000 | ORAL_TABLET | Freq: Every day | ORAL | Status: DC
  Administered 2012-12-06: 2 via ORAL

## 2012-12-06 MED ORDER — DIPHENHYDRAMINE HCL 50 MG/ML IJ SOLN: 12.5000 mg | INTRAMUSCULAR | Status: DC | PRN

## 2012-12-06 MED ORDER — LIDOCAINE HCL (PF) 1 % IJ SOLN
30.0000 mL | INTRAMUSCULAR | Status: DC | PRN
  Filled 2012-12-06: qty 30

## 2012-12-06 MED ORDER — OXYTOCIN 40 UNITS IN LACTATED RINGERS INFUSION - SIMPLE MED
62.5000 mL/h | INTRAVENOUS | Status: DC
  Filled 2012-12-06: qty 1000

## 2012-12-06 MED ORDER — WITCH HAZEL-GLYCERIN EX PADS: 1.0000 "application " | MEDICATED_PAD | CUTANEOUS | Status: DC | PRN

## 2012-12-06 MED ORDER — IBUPROFEN 600 MG PO TABS
600.0000 mg | ORAL_TABLET | Freq: Four times a day (QID) | ORAL | Status: DC
  Administered 2012-12-06 – 2012-12-07 (×4): 600 mg via ORAL
  Filled 2012-12-06 (×4): qty 1

## 2012-12-06 MED ORDER — EPHEDRINE 5 MG/ML INJ
10.0000 mg | INTRAVENOUS | Status: DC | PRN
  Filled 2012-12-06: qty 4

## 2012-12-06 MED ORDER — PNEUMOCOCCAL VAC POLYVALENT 25 MCG/0.5ML IJ INJ
0.5000 mL | INJECTION | INTRAMUSCULAR | Status: AC
  Administered 2012-12-07: 0.5 mL via INTRAMUSCULAR
  Filled 2012-12-06: qty 0.5

## 2012-12-06 MED ORDER — PHENYLEPHRINE 40 MCG/ML (10ML) SYRINGE FOR IV PUSH (FOR BLOOD PRESSURE SUPPORT)
80.0000 ug | PREFILLED_SYRINGE | INTRAVENOUS | Status: DC | PRN
  Filled 2012-12-06: qty 5

## 2012-12-06 MED ORDER — PHENYLEPHRINE 40 MCG/ML (10ML) SYRINGE FOR IV PUSH (FOR BLOOD PRESSURE SUPPORT): 80.0000 ug | PREFILLED_SYRINGE | INTRAVENOUS | Status: DC | PRN

## 2012-12-06 MED ORDER — FLEET ENEMA 7-19 GM/118ML RE ENEM: 1.0000 | ENEMA | RECTAL | Status: DC | PRN

## 2012-12-06 MED ORDER — DIPHENHYDRAMINE HCL 25 MG PO CAPS: 25.0000 mg | ORAL_CAPSULE | Freq: Four times a day (QID) | ORAL | Status: DC | PRN

## 2012-12-06 MED ORDER — ZOLPIDEM TARTRATE 5 MG PO TABS: 5.0000 mg | ORAL_TABLET | Freq: Every evening | ORAL | Status: DC | PRN

## 2012-12-06 MED ORDER — DIBUCAINE 1 % RE OINT: 1.0000 "application " | TOPICAL_OINTMENT | RECTAL | Status: DC | PRN

## 2012-12-06 MED ORDER — LACTATED RINGERS IV SOLN: 500.0000 mL | Freq: Once | INTRAVENOUS | Status: DC

## 2012-12-06 MED ORDER — OXYCODONE-ACETAMINOPHEN 5-325 MG PO TABS
1.0000 | ORAL_TABLET | ORAL | Status: DC | PRN
  Administered 2012-12-06 (×2): 1 via ORAL
  Filled 2012-12-06 (×2): qty 1

## 2012-12-06 MED ORDER — ONDANSETRON HCL 4 MG/2ML IJ SOLN: 4.0000 mg | INTRAMUSCULAR | Status: DC | PRN

## 2012-12-06 MED ORDER — PRENATAL MULTIVITAMIN CH
1.0000 | ORAL_TABLET | Freq: Every day | ORAL | Status: DC
  Administered 2012-12-06 – 2012-12-07 (×2): 1 via ORAL
  Filled 2012-12-06 (×2): qty 1

## 2012-12-06 MED ORDER — EPHEDRINE 5 MG/ML INJ: 10.0000 mg | INTRAVENOUS | Status: DC | PRN

## 2012-12-06 MED ORDER — FENTANYL 2.5 MCG/ML BUPIVACAINE 1/10 % EPIDURAL INFUSION (WH - ANES)
14.0000 mL/h | INTRAMUSCULAR | Status: DC
  Filled 2012-12-06: qty 125

## 2012-12-06 MED ORDER — ALBUTEROL SULFATE HFA 108 (90 BASE) MCG/ACT IN AERS
2.0000 | INHALATION_SPRAY | Freq: Four times a day (QID) | RESPIRATORY_TRACT | Status: DC | PRN
  Filled 2012-12-06: qty 6.7

## 2012-12-06 MED ORDER — ACETAMINOPHEN 325 MG PO TABS: 650.0000 mg | ORAL_TABLET | ORAL | Status: DC | PRN

## 2012-12-06 MED ORDER — CITRIC ACID-SODIUM CITRATE 334-500 MG/5ML PO SOLN: 30.0000 mL | ORAL | Status: DC | PRN

## 2012-12-06 MED ORDER — OXYCODONE-ACETAMINOPHEN 5-325 MG PO TABS
1.0000 | ORAL_TABLET | ORAL | Status: DC | PRN
  Administered 2012-12-06: 1 via ORAL
  Filled 2012-12-06: qty 1

## 2012-12-06 MED ORDER — IBUPROFEN 600 MG PO TABS
600.0000 mg | ORAL_TABLET | Freq: Four times a day (QID) | ORAL | Status: DC | PRN
  Administered 2012-12-06: 600 mg via ORAL
  Filled 2012-12-06: qty 1

## 2012-12-06 MED ORDER — TETANUS-DIPHTH-ACELL PERTUSSIS 5-2.5-18.5 LF-MCG/0.5 IM SUSP
0.5000 mL | Freq: Once | INTRAMUSCULAR | Status: AC
  Administered 2012-12-07: 0.5 mL via INTRAMUSCULAR

## 2012-12-06 MED ORDER — ONDANSETRON HCL 4 MG PO TABS: 4.0000 mg | ORAL_TABLET | ORAL | Status: DC | PRN

## 2012-12-06 MED ORDER — SIMETHICONE 80 MG PO CHEW: 80.0000 mg | CHEWABLE_TABLET | ORAL | Status: DC | PRN

## 2012-12-06 MED ORDER — OXYTOCIN BOLUS FROM INFUSION
500.0000 mL | INTRAVENOUS | Status: DC
  Administered 2012-12-06: 500 mL via INTRAVENOUS

## 2012-12-06 MED ORDER — ONDANSETRON HCL 4 MG/2ML IJ SOLN: 4.0000 mg | Freq: Four times a day (QID) | INTRAMUSCULAR | Status: DC | PRN

## 2012-12-06 MED ORDER — LACTATED RINGERS IV SOLN
INTRAVENOUS | Status: DC
  Administered 2012-12-06: 07:00:00 via INTRAVENOUS

## 2012-12-06 NOTE — MAU Note (Signed)
PT SAYS SHE HAD VE YESTERDAY - 3 CM.    DENIES HSV AND MRSA.  HURT BAD  SINCE 0500.

## 2012-12-06 NOTE — Progress Notes (Signed)
UR chart review completed.  

## 2012-12-06 NOTE — H&P (Signed)
22 y.o. [redacted]w[redacted]d  G2P1001 comes in c/o ctx.  Otherwise has good fetal movement and no bleeding.  Past Medical History  Diagnosis Date  . Urinary tract infection   . Asthma     since age 72; uses advair daily, albuterol as needed    Past Surgical History  Procedure Date  . No past surgeries     OB History    Grav Para Term Preterm Abortions TAB SAB Ect Mult Living   2 1 1  0 0 0 0 0 0 1     # Outc Date GA Lbr Len/2nd Wgt Sex Del Anes PTL Lv   1 TRM     M SVD EPI No Yes   2 CUR               History   Social History  . Marital Status: Single    Spouse Name: N/A    Number of Children: N/A  . Years of Education: N/A   Occupational History  . Not on file.   Social History Main Topics  . Smoking status: Former Smoker    Types: Cigarettes  . Smokeless tobacco: Never Used  . Alcohol Use: No  . Drug Use: No     Comment: quit smoking cigarettes (tobacco) before pregnancy.  . Sexually Active: Yes   Other Topics Concern  . Not on file   Social History Narrative  . No narrative on file   Review of patient's allergies indicates no known allergies.    Prenatal Transfer Tool  Maternal Diabetes: No Genetic Screening: Normal Fetal Ultrasounds or other Referrals:  Other: Anatomy scan result not listed in record Maternal Substance Abuse:  No Significant Maternal Medications:  None Significant Maternal Lab Results: Lab values include: Group B Strep negative, Rh negative  Other PNC:    Filed Vitals:   12/06/12 0726  BP:   Pulse:   Temp: 97.4 F (36.3 C)  Resp: 20     Lungs/Cor:  NAD Abdomen:  soft, gravid Ex:  no cords, erythema SVE:  5/90 per MAU FHTs:  115, good STV, NST R Toco:  q2-3   A/P   Admit in labor Routine care Check anatomy scan from office records  GBS Neg  [place], Taren Dymek

## 2012-12-07 LAB — CBC
MCH: 26.8 pg (ref 26.0–34.0)
MCV: 83.2 fL (ref 78.0–100.0)
Platelets: 124 10*3/uL — ABNORMAL LOW (ref 150–400)
RBC: 4.1 MIL/uL (ref 3.87–5.11)
RDW: 14.1 % (ref 11.5–15.5)
WBC: 12 10*3/uL — ABNORMAL HIGH (ref 4.0–10.5)

## 2012-12-07 MED ORDER — OXYCODONE-ACETAMINOPHEN 5-325 MG PO TABS: 1.0000 | ORAL_TABLET | ORAL | Status: DC | PRN

## 2012-12-07 MED ORDER — IBUPROFEN 600 MG PO TABS: 600.0000 mg | ORAL_TABLET | Freq: Four times a day (QID) | ORAL | Status: DC

## 2012-12-07 NOTE — Discharge Summary (Signed)
Obstetric Discharge Summary Reason for Admission: onset of labor Prenatal Procedures: none Intrapartum Procedures: spontaneous vaginal delivery Postpartum Procedures: Rho(D) Ig Complications-Operative and Postpartum: none Hemoglobin  Date Value Range Status  12/07/2012 11.0* 12.0 - 15.0 g/dL Final     HCT  Date Value Range Status  12/07/2012 34.1* 36.0 - 46.0 % Final    Physical Exam:  General: alert and cooperative Lochia: appropriate Uterine Fundus: firm DVT Evaluation: No evidence of DVT seen on physical exam.  Discharge Diagnoses: Term Pregnancy-delivered  Discharge Information: Date: 12/07/2012 Activity: pelvic rest Diet: routine Medications: PNV, Ibuprofen and Percocet Condition: stable Instructions: refer to practice specific booklet Discharge to: home Follow-up Information    Follow up with Sarahgrace Broman, DO. In 4 weeks.   Contact information:   [address] [place] [address] [phone]          Newborn Data: Live born female  Birth Weight: 6 lb 13.2 oz (3095 g) APGAR: 9, 9  Home with mother.  Philip Aspen 12/07/2012, 9:35 AM

## 2013-04-09 LAB — TYPE AND SCREEN
Antibody Screen: POSITIVE
Unit division: 0

## 2013-06-24 IMAGING — CR DG CHEST 2V
2 series · 2 of 2 positions shown · non-contrast
Comparison: Chest radiograph performed 08/22/2009

CLINICAL DATA: Shortness of breath and cough for 1 week; history of
asthma.

CHEST - 2 VIEW

[w chest pa]
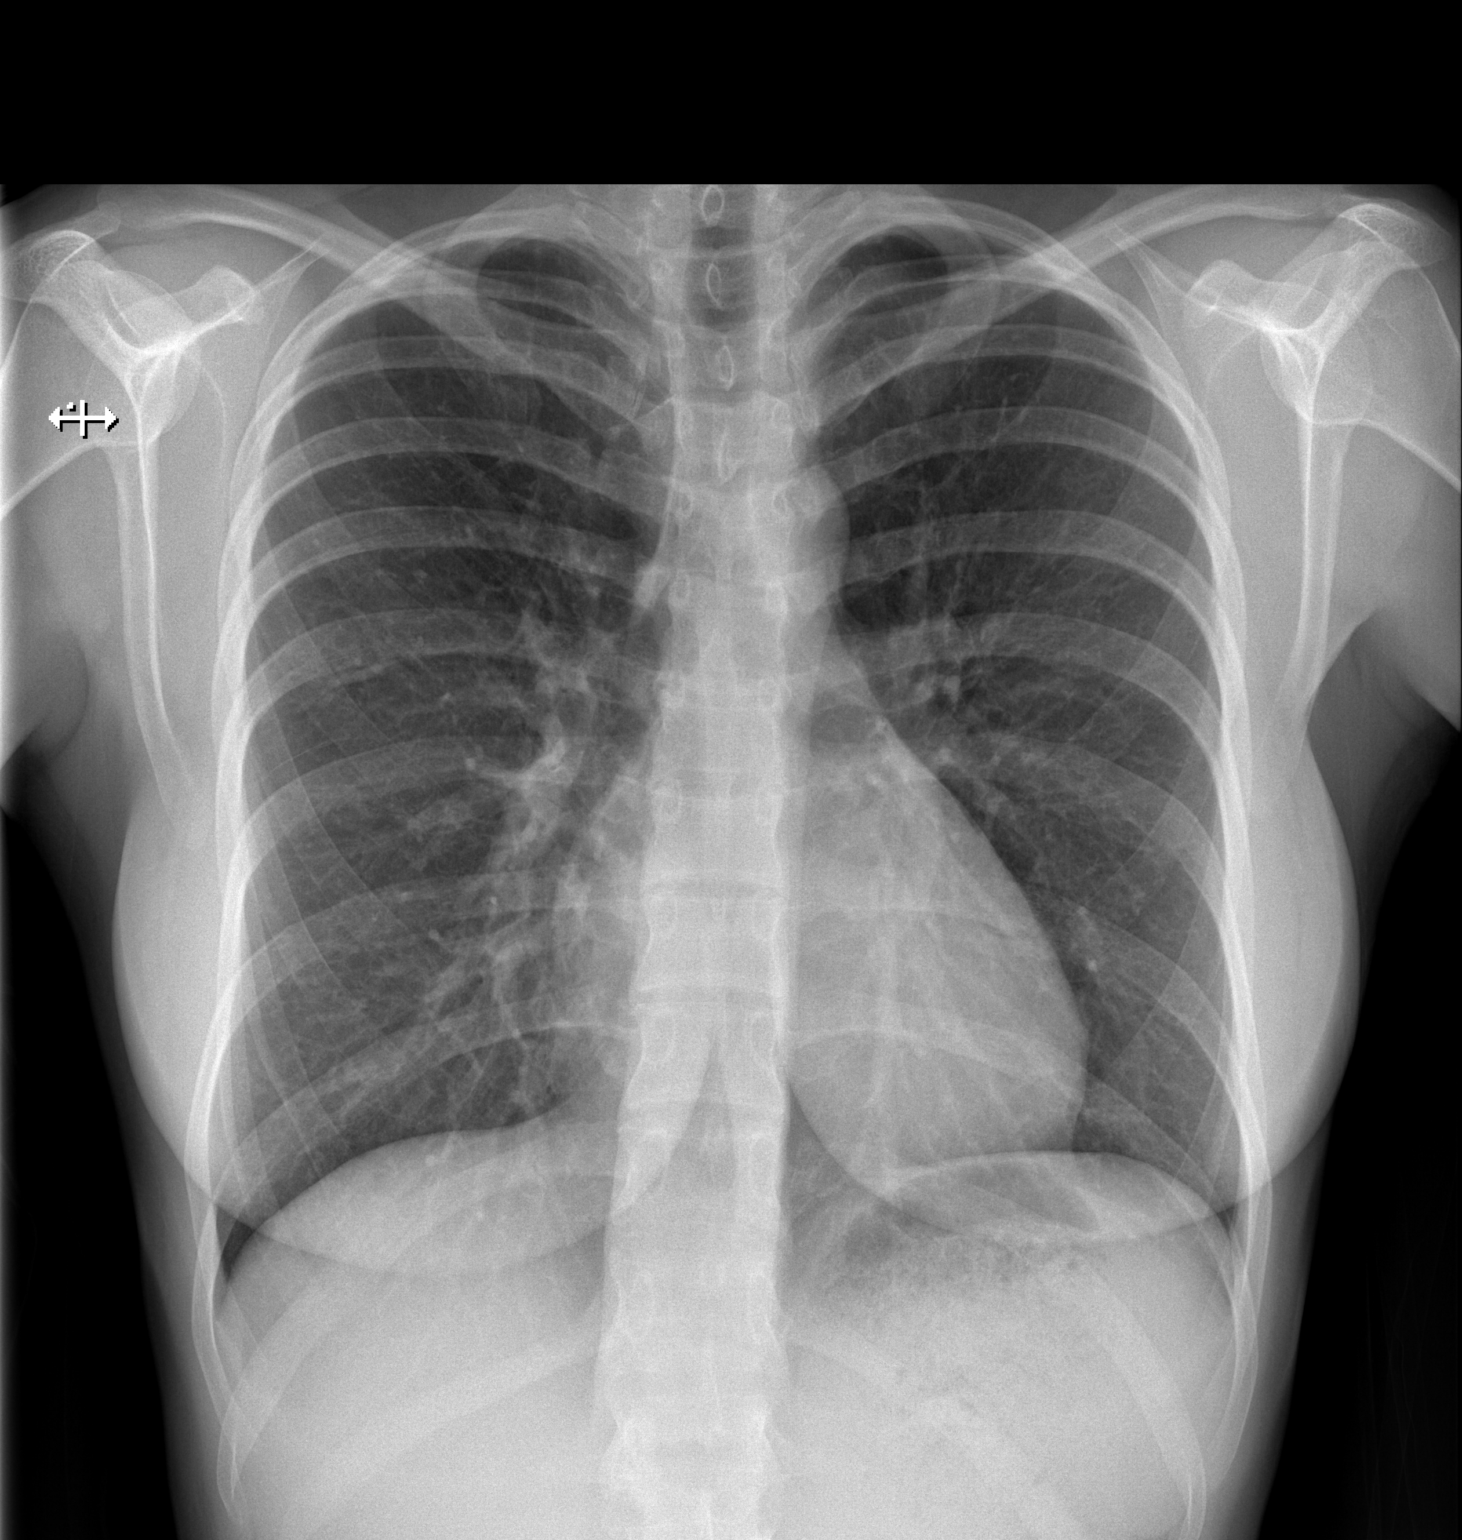

[w chest lat]
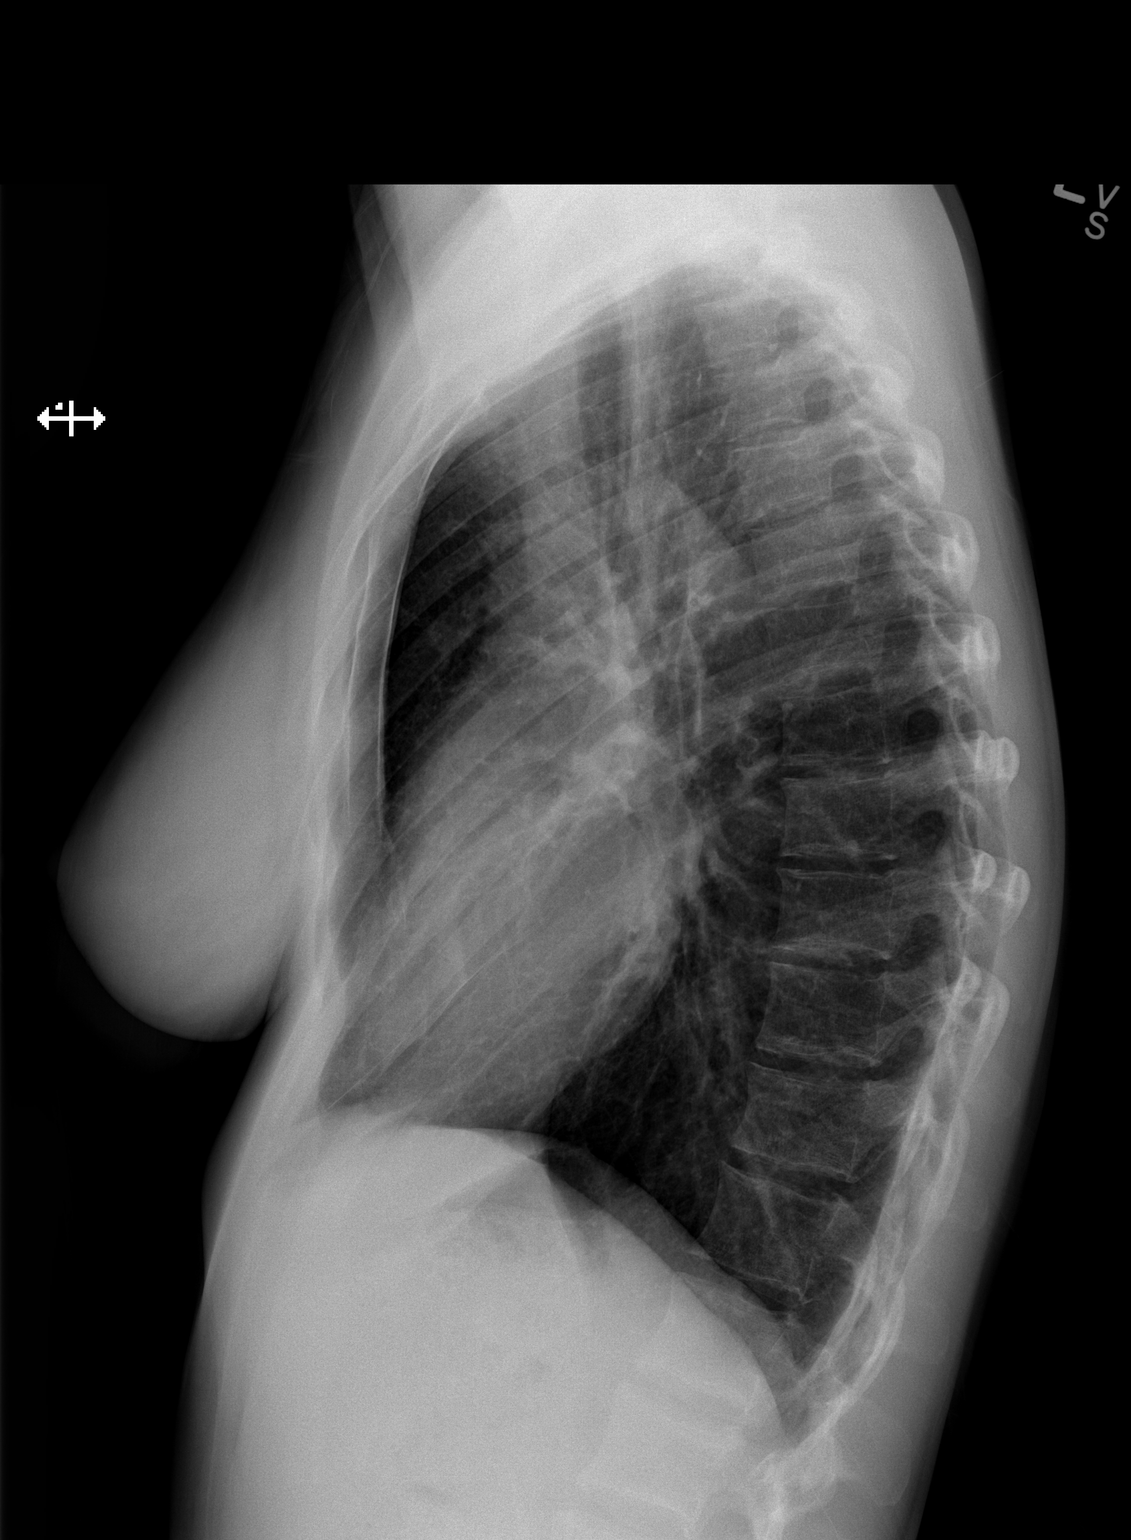

[2 of 2 positions shown; findings below may reference images not displayed]

FINDINGS: The lungs are well-aerated and clear.  There is no
evidence of focal opacification, pleural effusion or pneumothorax.

The heart is normal in size; the mediastinal contour is within
normal limits.  No acute osseous abnormalities are seen.
IMPRESSION: No acute cardiopulmonary process seen.

## 2014-08-21 ENCOUNTER — Emergency Department (HOSPITAL_COMMUNITY)
Admission: EM | Admit: 2014-08-21 | Discharge: 2014-08-21 | Disposition: A | Discharge status: Home / Self Care | Payer: BC Managed Care – PPO | Attending: Emergency Medicine | Admitting: Emergency Medicine

## 2014-08-21 ENCOUNTER — Encounter (HOSPITAL_COMMUNITY): Payer: Self-pay | Admitting: Emergency Medicine

## 2014-08-21 DIAGNOSIS — Z3202 Encounter for pregnancy test, result negative: Secondary | ICD-10-CM | POA: Diagnosis not present

## 2014-08-21 DIAGNOSIS — Z79899 Other long term (current) drug therapy: Secondary | ICD-10-CM | POA: Insufficient documentation

## 2014-08-21 DIAGNOSIS — Z87440 Personal history of urinary (tract) infections: Secondary | ICD-10-CM | POA: Insufficient documentation

## 2014-08-21 DIAGNOSIS — R509 Fever, unspecified: Secondary | ICD-10-CM | POA: Diagnosis present

## 2014-08-21 DIAGNOSIS — H9201 Otalgia, right ear: Secondary | ICD-10-CM | POA: Diagnosis not present

## 2014-08-21 DIAGNOSIS — J020 Streptococcal pharyngitis: Secondary | ICD-10-CM | POA: Insufficient documentation

## 2014-08-21 DIAGNOSIS — J45909 Unspecified asthma, uncomplicated: Secondary | ICD-10-CM | POA: Insufficient documentation

## 2014-08-21 DIAGNOSIS — R000 Tachycardia, unspecified: Secondary | ICD-10-CM | POA: Diagnosis not present

## 2014-08-21 DIAGNOSIS — Z87891 Personal history of nicotine dependence: Secondary | ICD-10-CM | POA: Insufficient documentation

## 2014-08-21 LAB — BASIC METABOLIC PANEL
ANION GAP: 15 (ref 5–15)
BUN: 9 mg/dL (ref 6–23)
CALCIUM: 9.1 mg/dL (ref 8.4–10.5)
CO2: 20 meq/L (ref 19–32)
Chloride: 101 mEq/L (ref 96–112)
Creatinine, Ser: 0.81 mg/dL (ref 0.50–1.10)
GFR calc Af Amer: >90 mL/min (ref >90)
Glucose, Bld: 98 mg/dL (ref 70–99)
Potassium: 3.4 mEq/L — ABNORMAL LOW (ref 3.7–5.3)
SODIUM: 136 meq/L — AB (ref 137–147)

## 2014-08-21 LAB — URINALYSIS, ROUTINE W REFLEX MICROSCOPIC
Bilirubin Urine: NEGATIVE
Glucose, UA: NEGATIVE mg/dL
KETONES UR: NEGATIVE mg/dL
LEUKOCYTES UA: NEGATIVE
NITRITE: NEGATIVE
PH: 8 (ref 5.0–8.0)
PROTEIN: NEGATIVE mg/dL
Specific Gravity, Urine: 1.012 (ref 1.005–1.030)
UROBILINOGEN UA: 0.2 mg/dL (ref 0.0–1.0)

## 2014-08-21 LAB — CBC WITH DIFFERENTIAL/PLATELET
Basophils Absolute: 0 10*3/uL (ref 0.0–0.1)
Basophils Relative: 0 % (ref 0–1)
Eosinophils Absolute: 0 10*3/uL (ref 0.0–0.7)
Eosinophils Relative: 0 % (ref 0–5)
HEMATOCRIT: 37.6 % (ref 36.0–46.0)
HEMOGLOBIN: 13 g/dL (ref 12.0–15.0)
LYMPHS PCT: 20 % (ref 12–46)
Lymphs Abs: 1.6 10*3/uL (ref 0.7–4.0)
MCH: 28.6 pg (ref 26.0–34.0)
MCHC: 34.6 g/dL (ref 30.0–36.0)
MCV: 82.8 fL (ref 78.0–100.0)
MONO ABS: 0.8 10*3/uL (ref 0.1–1.0)
MONOS PCT: 10 % (ref 3–12)
NEUTROS ABS: 5.4 10*3/uL (ref 1.7–7.7)
Neutrophils Relative %: 70 % (ref 43–77)
Platelets: 166 10*3/uL (ref 150–400)
RBC: 4.54 MIL/uL (ref 3.87–5.11)
RDW: 12.4 % (ref 11.5–15.5)
WBC: 7.8 10*3/uL (ref 4.0–10.5)

## 2014-08-21 LAB — URINE MICROSCOPIC-ADD ON

## 2014-08-21 LAB — RAPID STREP SCREEN (MED CTR MEBANE ONLY): Streptococcus, Group A Screen (Direct): NEGATIVE

## 2014-08-21 LAB — POC URINE PREG, ED: PREG TEST UR: NEGATIVE

## 2014-08-21 MED ORDER — SODIUM CHLORIDE 0.9 % IV BOLUS (SEPSIS)
1000.0000 mL | Freq: Once | INTRAVENOUS | Status: AC
  Administered 2014-08-21: 1000 mL via INTRAVENOUS

## 2014-08-21 MED ORDER — PENICILLIN G BENZATHINE 1200000 UNIT/2ML IM SUSP
1.2000 10*6.[IU] | Freq: Once | INTRAMUSCULAR | Status: AC
  Administered 2014-08-21: 1.2 10*6.[IU] via INTRAMUSCULAR
  Filled 2014-08-21: qty 2

## 2014-08-21 MED ORDER — ACETAMINOPHEN 325 MG PO TABS
650.0000 mg | ORAL_TABLET | Freq: Once | ORAL | Status: AC
  Administered 2014-08-21: 650 mg via ORAL
  Filled 2014-08-21: qty 2

## 2014-08-21 MED ORDER — POTASSIUM CHLORIDE CRYS ER 20 MEQ PO TBCR
40.0000 meq | EXTENDED_RELEASE_TABLET | Freq: Once | ORAL | Status: AC
  Administered 2014-08-21: 40 meq via ORAL
  Filled 2014-08-21: qty 2

## 2014-08-21 NOTE — ED Notes (Signed)
The pt has anxiety issues.  She is c/o sore throat headache chills currently has carpo--pedal spasms.  Initially according to the mail with her she was hyperventialting.  Cautioned to keep  Breathing normally

## 2014-08-21 NOTE — Discharge Instructions (Signed)

## 2014-08-23 LAB — URINE CULTURE
Colony Count: NO GROWTH
Culture: NO GROWTH

## 2014-08-23 LAB — CULTURE, GROUP A STREP

## 2014-08-23 NOTE — ED Provider Notes (Signed)
CSN: [id number]     Arrival date & time 08/21/14  1939 History   First MD Initiated Contact with Patient 08/21/14 2148     Chief Complaint  Patient presents with  . multiple complaints      (Consider location/radiation/quality/duration/timing/severity/associated sxs/prior Treatment) Patient is a 23 y.o. female presenting with general illness. The history is provided by the patient. No language interpreter was used.  Illness Location:  Generalized Severity:  Moderate Onset quality:  Gradual Duration:  1 day Timing:  Constant Progression:  Worsening Chronicity:  New Relieved by:  Nothing Worsened by:  Nothing Ineffective treatments:  None tried Associated symptoms: ear pain, fatigue, fever, headaches, myalgias, rhinorrhea and sore throat   Associated symptoms: no abdominal pain, no chest pain, no congestion, no cough, no diarrhea, no loss of consciousness, no nausea, no rash, no shortness of breath, no vomiting and no wheezing   Ear pain:    Location:  Right   Severity:  Moderate Sore throat:    Severity:  Moderate   Onset quality:  Gradual   Duration:  1 day   Timing:  Constant   Progression:  Worsening   Past Medical History  Diagnosis Date  . Urinary tract infection   . Asthma     since age 89; uses advair daily, albuterol as needed   Past Surgical History  Procedure Laterality Date  . No past surgeries     Family History  Problem Relation Age of Onset  . Hypertension Other   . Diabetes Other   . Other Neg Hx    History  Substance Use Topics  . Smoking status: Former Smoker    Types: Cigarettes  . Smokeless tobacco: Never Used  . Alcohol Use: No   OB History   Grav Para Term Preterm Abortions TAB SAB Ect Mult Living   2 2 2  0 0 0 0 0 0 2     Review of Systems  Constitutional: Positive for fever and fatigue. Negative for chills, diaphoresis, activity change and appetite change.  HENT: Positive for ear pain, rhinorrhea and sore throat. Negative for  congestion and facial swelling.   Eyes: Negative for photophobia and discharge.  Respiratory: Negative for cough, chest tightness, shortness of breath and wheezing.   Cardiovascular: Negative for chest pain, palpitations and leg swelling.  Gastrointestinal: Negative for nausea, vomiting, abdominal pain and diarrhea.  Endocrine: Negative for polydipsia and polyuria.  Genitourinary: Negative for dysuria, frequency, difficulty urinating and pelvic pain.  Musculoskeletal: Positive for myalgias. Negative for arthralgias, back pain, neck pain and neck stiffness.  Skin: Negative for color change, rash and wound.  Allergic/Immunologic: Negative for immunocompromised state.  Neurological: Positive for headaches. Negative for loss of consciousness, facial asymmetry, weakness and numbness.  Hematological: Does not bruise/bleed easily.  Psychiatric/Behavioral: Negative for confusion and agitation.      Allergies  Review of patient's allergies indicates no known allergies.  Home Medications   Prior to Admission medications   Medication Sig Start Date End Date Taking? Authorizing Provider  albuterol (PROVENTIL HFA;VENTOLIN HFA) 108 (90 BASE) MCG/ACT inhaler Inhale 2 puffs into the lungs every 6 (six) hours as needed. For shortness of breath   Yes Historical Provider, MD  etonogestrel (NEXPLANON) 68 MG IMPL implant 1 each by Subdermal route once.   Yes Historical Provider, MD   BP 105/56  Pulse 88  Temp(Src) 98 F (36.7 C) (Oral)  Resp 18  Ht 5\' 2"  (1.575 m)  Wt 130 lb (58.968 kg)  BMI  23.77 kg/m2  SpO2 97%  LMP 08/14/2014 Physical Exam  Constitutional: She is oriented to person, place, and time. She appears well-developed and well-nourished. No distress.  HENT:  Head: Normocephalic and atraumatic.  Mouth/Throat: No oropharyngeal exudate.  Eyes: Pupils are equal, round, and reactive to light.  Neck: Normal range of motion. Neck supple.  Cardiovascular: Regular rhythm and normal heart  sounds.  Tachycardia present.  Exam reveals no gallop and no friction rub.   No murmur heard. Pulmonary/Chest: Effort normal and breath sounds normal. No respiratory distress. She has no wheezes. She has no rales.  Abdominal: Soft. Bowel sounds are normal. She exhibits no distension and no mass. There is no tenderness. There is no rebound and no guarding.  Musculoskeletal: Normal range of motion. She exhibits no edema and no tenderness.  Neurological: She is alert and oriented to person, place, and time.  Skin: Skin is warm and dry.  Psychiatric: She has a normal mood and affect.    ED Course  Procedures (including critical care time) Labs Review Labs Reviewed  BASIC METABOLIC PANEL - Abnormal; Notable for the following:    Sodium 136 (*)    Potassium 3.4 (*)    All other components within normal limits  URINALYSIS, ROUTINE W REFLEX MICROSCOPIC - Abnormal; Notable for the following:    APPearance CLOUDY (*)    Hgb urine dipstick TRACE (*)    All other components within normal limits  URINE MICROSCOPIC-ADD ON - Abnormal; Notable for the following:    Squamous Epithelial / LPF FEW (*)    All other components within normal limits  RAPID STREP SCREEN  CULTURE, GROUP A STREP  URINE CULTURE  CBC WITH DIFFERENTIAL  POC URINE PREG, ED    Imaging Review No results found.   EKG Interpretation None      MDM   Final diagnoses:  Strep pharyngitis    Pt is a 23 y.o. female with Pmhx as above who presents with a one day of fever, chills, body aches, sore throat. She has no cough. She has had no nausea vomiting or diarrhea. She states that she was having spasms in her hands earlier today. He physical exam patient is febrile, tachycardic. She has bilateral tonsillar exudates. Tender cervical lymphadenopathy. Using centor criteria, we'll treat for suspected strep pharyngitis with IM Bicillin. IV fluids and will given him with improvement of vital signs. We'll discharge home structures for  supportive care. Return precautions given for or worsening symptoms including syncope and, swelling, inability to tolerate liquids.       Toy CookeyMegan Docherty, MD 08/23/14 [phone]

## 2014-09-07 ENCOUNTER — Encounter (HOSPITAL_COMMUNITY): Payer: Self-pay | Admitting: Emergency Medicine

## 2015-09-03 ENCOUNTER — Encounter: Payer: Self-pay | Admitting: *Deleted

## 2015-09-03 ENCOUNTER — Ambulatory Visit
Admission: EM | Admit: 2015-09-03 | Discharge: 2015-09-03 | Disposition: A | Discharge status: Home / Self Care | Payer: 59 | Attending: Family Medicine | Admitting: Family Medicine

## 2015-09-03 DIAGNOSIS — J4520 Mild intermittent asthma, uncomplicated: Secondary | ICD-10-CM | POA: Diagnosis not present

## 2015-09-03 DIAGNOSIS — Z760 Encounter for issue of repeat prescription: Secondary | ICD-10-CM

## 2015-09-03 MED ORDER — ALBUTEROL SULFATE HFA 108 (90 BASE) MCG/ACT IN AERS: 2.0000 | INHALATION_SPRAY | Freq: Four times a day (QID) | RESPIRATORY_TRACT | Status: DC | PRN

## 2015-09-03 NOTE — Discharge Instructions (Signed)
Asthma, Adult Asthma is a condition of the lungs in which the airways tighten and narrow. Asthma can make it hard to breathe. Asthma cannot be cured, but medicine and lifestyle changes can help control it. Asthma may be started (triggered) by:  Animal skin flakes (dander).  Dust.  Cockroaches.  Pollen.  Mold.  Smoke.  Cleaning products.  Hair sprays or aerosol sprays.  Paint fumes or strong smells.  Cold air, weather changes, and winds.  Crying or laughing hard.  Stress.  Certain medicines or drugs.  Foods, such as dried fruit, potato chips, and sparkling grape juice.  Infections or conditions (colds, flu).  Exercise.  Certain medical conditions or diseases.  Exercise or tiring activities. HOME CARE   Take medicine as told by your doctor.  Use a peak flow meter as told by your doctor. A peak flow meter is a tool that measures how well the lungs are working.  Record and keep track of the peak flow meter's readings.  Understand and use the asthma action plan. An asthma action plan is a written plan for taking care of your asthma and treating your attacks.  To help prevent asthma attacks:  Do not smoke. Stay away from secondhand smoke.  Change your heating and air conditioning filter often.  Limit your use of fireplaces and wood stoves.  Get rid of pests (such as roaches and mice) and their droppings.  Throw away plants if you see mold on them.  Clean your floors. Dust regularly. Use cleaning products that do not smell.  Have someone vacuum when you are not home. Use a vacuum cleaner with a HEPA filter if possible.  Replace carpet with wood, tile, or vinyl flooring. Carpet can trap animal skin flakes and dust.  Use allergy-proof pillows, mattress covers, and box spring covers.  Wash bed sheets and blankets every week in hot water and dry them in a dryer.  Use blankets that are made of polyester or cotton.  Clean bathrooms and kitchens with bleach.  If possible, have someone repaint the walls in these rooms with mold-resistant paint. Keep out of the rooms that are being cleaned and painted.  Wash hands often. GET HELP IF:  You have make a whistling sound when breaking (wheeze), have shortness of breath, or have a cough even if taking medicine to prevent attacks.  The colored mucus you cough up (sputum) is thicker than usual.  The colored mucus you cough up changes from clear or white to yellow, green, gray, or bloody.  You have problems from the medicine you are taking such as:  A rash.  Itching.  Swelling.  Trouble breathing.  You need reliever medicines more than 2-3 times a week.  Your peak flow measurement is still at 50-79% of your personal best after following the action plan for 1 hour.  You have a fever. GET HELP RIGHT AWAY IF:   You seem to be worse and are not responding to medicine during an asthma attack.  You are short of breath even at rest.  You get short of breath when doing very little activity.  You have trouble eating, drinking, or talking.  You have chest pain.  You have a fast heartbeat.  Your lips or fingernails start to turn blue.  You are light-headed, dizzy, or faint.  Your peak flow is less than 50% of your personal best.   This information is not intended to replace advice given to you by your health care provider. Make sure   you discuss any questions you have with your health care provider.   Document Released: 04/10/2008 Document Revised: 07/14/2015 Document Reviewed: 05/22/2013 Elsevier Interactive Patient Education 2016 Elsevier Inc.  

## 2015-09-03 NOTE — ED Notes (Signed)
Pt states that she needs a refill on her albuterol inhaler

## 2015-09-03 NOTE — ED Provider Notes (Signed)
CSN: [id number]     Arrival date & time 09/03/15  1555 History   First MD Initiated Contact with Patient 09/03/15 1746     Chief Complaint  Patient presents with  . Medication Refill   (Consider location/radiation/quality/duration/timing/severity/associated sxs/prior Treatment) HPI Comments: Pt states she has hx of asthma, is out of her inhaler, ran out today. Currently "not having any issues, but I don't want to get into trouble". Called PCP and was unable to get an appt today, told to go to UC. Denies Cough, shortness of breath.   Patient is a 24 y.o. female presenting with shortness of breath. The history is provided by the patient. No language interpreter was used.  Shortness of Breath Severity: none. Onset quality:  Unable to specify Associated symptoms: no cough, no fever, no rash and no wheezing     Past Medical History  Diagnosis Date  . Urinary tract infection   . Asthma     since age 89; uses advair daily, albuterol as needed   Past Surgical History  Procedure Laterality Date  . No past surgeries     Family History  Problem Relation Age of Onset  . Hypertension Other   . Diabetes Other   . Other Neg Hx    Social History  Substance Use Topics  . Smoking status: Former Smoker    Types: Cigarettes  . Smokeless tobacco: Never Used  . Alcohol Use: No   OB History    Gravida Para Term Preterm AB TAB SAB Ectopic Multiple Living   2 2 2  0 0 0 0 0 0 2     Review of Systems  Constitutional: Negative for fever, activity change and appetite change.  HENT: Negative.   Eyes: Negative.   Respiratory: Negative for cough, chest tightness, shortness of breath and wheezing.   Cardiovascular: Negative.   Gastrointestinal: Negative.   Endocrine: Negative.   Genitourinary: Negative.   Skin: Negative for rash.  Allergic/Immunologic: Negative.   Neurological: Negative.   Hematological: Negative.   Psychiatric/Behavioral: Negative.   All other systems reviewed and are  negative.   Allergies  Review of patient's allergies indicates no known allergies.  Home Medications   Prior to Admission medications   Medication Sig Start Date End Date Taking? Authorizing Provider  albuterol (PROVENTIL HFA;VENTOLIN HFA) 108 (90 BASE) MCG/ACT inhaler Inhale 2 puffs into the lungs every 6 (six) hours as needed for wheezing or shortness of breath. 09/03/15   Clancy GourdJeanette Layna Roeper, NP  etonogestrel (NEXPLANON) 68 MG IMPL implant 1 each by Subdermal route once.    Historical Provider, MD   Meds Ordered and Administered this Visit  Medications - No data to display  BP 128/60 mmHg  Pulse 79  Temp(Src) 98.1 F (36.7 C) (Oral)  Ht 5\' 1"  (1.549 m)  Wt 123 lb (55.792 kg)  BMI 23.25 kg/m2  SpO2 100%  LMP 08/30/2015 No data found.   Physical Exam  Constitutional: She is oriented to person, place, and time. Vital signs are normal. She appears well-developed and well-nourished. She is active and cooperative. No distress.  HENT:  Head: Normocephalic.  Right Ear: Tympanic membrane normal.  Left Ear: Tympanic membrane normal.  Nose: Nose normal.  Mouth/Throat: Uvula is midline and oropharynx is clear and moist.  Eyes: Pupils are equal, round, and reactive to light.  Neck: Normal range of motion.  Cardiovascular: Normal rate, regular rhythm, normal heart sounds and normal pulses.   Pulmonary/Chest: Effort normal and breath sounds normal. No respiratory distress.  She has no decreased breath sounds. She has no wheezes. She has no rales. She exhibits no tenderness.  Musculoskeletal: Normal range of motion.  Neurological: She is alert and oriented to person, place, and time. GCS eye subscore is 4. GCS verbal subscore is 5. GCS motor subscore is 6.  Skin: Skin is warm.  Psychiatric: She has a normal mood and affect. Her speech is normal and behavior is normal.  Nursing note and vitals reviewed.   ED Course  Procedures (including critical care time)  Labs Review Labs  Reviewed - No data to display  Imaging Review No results found.      MDM   1. Asthma, mild intermittent, uncomplicated   2. Medication refill    Discussed plan of care with pt, will refill inhaler. Pt to f/u with her provider for further asthma management. To return to UC as needed. Pt verbalized understanding to this provider.     Clancy Gourd, NP 09/03/15 1929

## 2015-10-24 ENCOUNTER — Telehealth: Payer: Self-pay | Admitting: Emergency Medicine

## 2017-11-09 NOTE — Telephone Encounter (Signed)
close

## 2018-01-23 DIAGNOSIS — Z3046 Encounter for surveillance of implantable subdermal contraceptive: Secondary | ICD-10-CM | POA: Diagnosis not present

## 2018-01-23 DIAGNOSIS — Z3009 Encounter for other general counseling and advice on contraception: Secondary | ICD-10-CM | POA: Diagnosis not present

## 2018-10-09 DIAGNOSIS — J45909 Unspecified asthma, uncomplicated: Secondary | ICD-10-CM | POA: Diagnosis not present

## 2018-10-09 DIAGNOSIS — Z3491 Encounter for supervision of normal pregnancy, unspecified, first trimester: Secondary | ICD-10-CM | POA: Diagnosis not present

## 2018-10-09 DIAGNOSIS — O219 Vomiting of pregnancy, unspecified: Secondary | ICD-10-CM | POA: Diagnosis not present

## 2018-10-09 DIAGNOSIS — O26841 Uterine size-date discrepancy, first trimester: Secondary | ICD-10-CM | POA: Diagnosis not present

## 2018-10-09 DIAGNOSIS — O99519 Diseases of the respiratory system complicating pregnancy, unspecified trimester: Secondary | ICD-10-CM | POA: Diagnosis not present

## 2018-11-06 NOTE — L&D Delivery Note (Signed)
Operative Delivery Note At 5:55 PM a viable and healthy female was delivered via Vaginal, Vacuum Engineer, manufacturing systems).  Presentation: vertex; Position: Occiput,, Posterior; Station: +3.  Verbal consent: obtained from patient.  Risks and benefits discussed in detail.  Risks include, but are not limited to the risks of anesthesia, bleeding, infection, damage to maternal tissues, fetal cephalhematoma.  There is also the risk of inability to effect vaginal delivery of the head, or shoulder dystocia that cannot be resolved by established maneuvers, leading to the need for emergency cesarean section.  Fetal vertex was in direct OP presentation. Multiple attempts were made to rotate the fetal head. THe Head would rotate but return back to direct OP. The decision was made to attempt a vacuum assisted vaginal delivery. THe fetal head was delivered to crowning with 3 pop offs. THe infant cried vigorously after delivery. After being assessed by the neonatology team the infant was returned to the maternal abdomen.  APGAR: 8, 9; weight pending .   Placenta status: spontaneous, intact.   Cord: 3V    Anesthesia:  Epidural Instruments: Kiwi Episiotomy: None Lacerations: None Suture Repair: NA Est. Blood Loss (mL): 10  Mom to postpartum.  Baby to Couplet care / Skin to Skin.  Vanessa Kick 05/08/2019, 6:11 PM

## 2018-11-25 DIAGNOSIS — Z3482 Encounter for supervision of other normal pregnancy, second trimester: Secondary | ICD-10-CM | POA: Diagnosis not present

## 2018-11-25 DIAGNOSIS — O26849 Uterine size-date discrepancy, unspecified trimester: Secondary | ICD-10-CM | POA: Diagnosis not present

## 2018-11-25 DIAGNOSIS — Z6826 Body mass index (BMI) 26.0-26.9, adult: Secondary | ICD-10-CM | POA: Diagnosis not present

## 2018-11-25 DIAGNOSIS — Z36 Encounter for antenatal screening for chromosomal anomalies: Secondary | ICD-10-CM | POA: Diagnosis not present

## 2018-12-30 DIAGNOSIS — Z363 Encounter for antenatal screening for malformations: Secondary | ICD-10-CM | POA: Diagnosis not present

## 2018-12-30 DIAGNOSIS — Z3480 Encounter for supervision of other normal pregnancy, unspecified trimester: Secondary | ICD-10-CM | POA: Diagnosis not present

## 2019-02-05 DIAGNOSIS — Z3480 Encounter for supervision of other normal pregnancy, unspecified trimester: Secondary | ICD-10-CM | POA: Diagnosis not present

## 2019-03-07 DIAGNOSIS — Z3480 Encounter for supervision of other normal pregnancy, unspecified trimester: Secondary | ICD-10-CM | POA: Diagnosis not present

## 2019-03-07 DIAGNOSIS — Z23 Encounter for immunization: Secondary | ICD-10-CM | POA: Diagnosis not present

## 2019-03-07 DIAGNOSIS — O360990 Maternal care for other rhesus isoimmunization, unspecified trimester, not applicable or unspecified: Secondary | ICD-10-CM | POA: Diagnosis not present

## 2019-03-19 DIAGNOSIS — Z3480 Encounter for supervision of other normal pregnancy, unspecified trimester: Secondary | ICD-10-CM | POA: Diagnosis not present

## 2019-04-01 DIAGNOSIS — Z369 Encounter for antenatal screening, unspecified: Secondary | ICD-10-CM | POA: Diagnosis not present

## 2019-04-24 DIAGNOSIS — O26843 Uterine size-date discrepancy, third trimester: Secondary | ICD-10-CM | POA: Diagnosis not present

## 2019-04-30 ENCOUNTER — Encounter (HOSPITAL_COMMUNITY): Payer: Self-pay | Admitting: *Deleted

## 2019-04-30 ENCOUNTER — Inpatient Hospital Stay (HOSPITAL_COMMUNITY)
Admission: AD | Admit: 2019-04-30 | Discharge: 2019-04-30 | Disposition: A | Discharge status: Home / Self Care | Payer: BC Managed Care – PPO | Attending: Obstetrics and Gynecology | Admitting: Obstetrics and Gynecology

## 2019-04-30 ENCOUNTER — Other Ambulatory Visit: Payer: Self-pay

## 2019-04-30 DIAGNOSIS — Z3A36 36 weeks gestation of pregnancy: Secondary | ICD-10-CM | POA: Insufficient documentation

## 2019-04-30 DIAGNOSIS — O4703 False labor before 37 completed weeks of gestation, third trimester: Secondary | ICD-10-CM | POA: Insufficient documentation

## 2019-04-30 DIAGNOSIS — R109 Unspecified abdominal pain: Secondary | ICD-10-CM | POA: Diagnosis not present

## 2019-04-30 DIAGNOSIS — Z3689 Encounter for other specified antenatal screening: Secondary | ICD-10-CM

## 2019-04-30 DIAGNOSIS — O479 False labor, unspecified: Secondary | ICD-10-CM | POA: Diagnosis not present

## 2019-04-30 NOTE — Discharge Instructions (Signed)
Braxton Hicks Contractions Contractions of the uterus can occur throughout pregnancy, but they are not always a sign that you are in labor. You may have practice contractions called Braxton Hicks contractions. These false labor contractions are sometimes confused with true labor. What are Braxton Hicks contractions? Braxton Hicks contractions are tightening movements that occur in the muscles of the uterus before labor. Unlike true labor contractions, these contractions do not result in opening (dilation) and thinning of the cervix. Toward the end of pregnancy (32-34 weeks), Braxton Hicks contractions can happen more often and may become stronger. These contractions are sometimes difficult to tell apart from true labor because they can be very uncomfortable. You should not feel embarrassed if you go to the hospital with false labor. Sometimes, the only way to tell if you are in true labor is for your health care provider to look for changes in the cervix. The health care provider will do a physical exam and may monitor your contractions. If you are not in true labor, the exam should show that your cervix is not dilating and your water has not broken. If there are no other health problems associated with your pregnancy, it is completely safe for you to be sent home with false labor. You may continue to have Braxton Hicks contractions until you go into true labor. How to tell the difference between true labor and false labor True labor  Contractions last 30-70 seconds.  Contractions become very regular.  Discomfort is usually felt in the top of the uterus, and it spreads to the lower abdomen and low back.  Contractions do not go away with walking.  Contractions usually become more intense and increase in frequency.  The cervix dilates and gets thinner. False labor  Contractions are usually shorter and not as strong as true labor contractions.  Contractions are usually irregular.  Contractions  are often felt in the front of the lower abdomen and in the groin.  Contractions may go away when you walk around or change positions while lying down.  Contractions get weaker and are shorter-lasting as time goes on.  The cervix usually does not dilate or become thin. Follow these instructions at home:   Take over-the-counter and prescription medicines only as told by your health care provider.  Keep up with your usual exercises and follow other instructions from your health care provider.  Eat and drink lightly if you think you are going into labor.  If Braxton Hicks contractions are making you uncomfortable: ? Change your position from lying down or resting to walking, or change from walking to resting. ? Sit and rest in a tub of warm water. ? Drink enough fluid to keep your urine pale yellow. Dehydration may cause these contractions. ? Do slow and deep breathing several times an hour.  Keep all follow-up prenatal visits as told by your health care provider. This is important. Contact a health care provider if:  You have a fever.  You have continuous pain in your abdomen. Get help right away if:  Your contractions become stronger, more regular, and closer together.  You have fluid leaking or gushing from your vagina.  You pass blood-tinged mucus (bloody show).  You have bleeding from your vagina.  You have low back pain that you never had before.  You feel your baby's head pushing down and causing pelvic pressure.  Your baby is not moving inside you as much as it used to. Summary  Contractions that occur before labor are   called Braxton Hicks contractions, false labor, or practice contractions.  Braxton Hicks contractions are usually shorter, weaker, farther apart, and less regular than true labor contractions. True labor contractions usually become progressively stronger and regular, and they become more frequent.  Manage discomfort from Braxton Hicks contractions  by changing position, resting in a warm bath, drinking plenty of water, or practicing deep breathing. This information is not intended to replace advice given to you by your health care provider. Make sure you discuss any questions you have with your health care provider. Document Released: 03/08/2017 Document Revised: 08/07/2017 Document Reviewed: 03/08/2017 Elsevier Interactive Patient Education  2019 Elsevier Inc.  

## 2019-04-30 NOTE — MAU Note (Signed)
I have communicated with Maryelizabeth Kaufmann CNM and reviewed vital signs:  Vitals:   04/30/19 1519 04/30/19 1629  BP: 125/74 121/64  Pulse: 97 78  Resp: 16 16  Temp: 98 F (36.7 C)   SpO2: 99%     Vaginal exam:  Dilation: Fingertip Effacement (%): 50 Cervical Position: Posterior Station: -3 Presentation: Vertex Exam by:: Trisha Morandi rn,   Also reviewed contraction pattern and that non-stress test is reactive.  It has been documented that patient is contracting every irregular with irritability with no cervical change over an  hour not indicating active labor.  Patient denies any other complaints.  Based on this report provider has given order for discharge.  A discharge order and diagnosis entered by a provider.   Labor discharge instructions reviewed with patient.

## 2019-04-30 NOTE — MAU Provider Note (Signed)
  S: Ms. SHAUN ZUCCARO is a 29 y.o. G3P2002 at [redacted]w[redacted]d  who presents to MAU today complaining of "cramping" since 0900 today.  She called clinic and was advised to come to MAU for a cervical check. She denies vaginal bleeding. She denies LOF. She reports normal fetal movement.  She rates her pain as 3/10 upon arrival in MAU.  O: BP 125/74   Pulse 97   Temp 98 F (36.7 C)   Resp 16   LMP 08/18/2018   SpO2 99%  GENERAL: Well-developed, well-nourished female in no acute distress.  HEAD: Normocephalic, atraumatic.  CHEST: Normal effort of breathing, regular heart rate ABDOMEN: Soft, nontender, gravid  Cervical exam:  Dilation: Fingertip Effacement (%): 50 Cervical Position: Posterior Presentation: Vertex Exam by:: ginger morris rn   Fetal Monitoring: Baseline: 140 Variability: Moderate Accelerations: Positive 15 x 15 Decelerations: None Contractions: q 2-5, UI, palpate mild  A: SIUP at [redacted]w[redacted]d  False labor  Normotensive Patient verbalizes to RN that her pain is less intense since arriving in MAU  P: Discharge home in stable condition  F/U: Patient's next clinic appointment is tomorrow   Kieth Brightly 04/30/2019 4:35 PM

## 2019-04-30 NOTE — MAU Note (Signed)
.   LECIA ESPERANZA is a 28 y.o. at [redacted]w[redacted]d here in MAU reporting: lower abdominal cramping since 9am. Denies any vaginal bleeding or LOF Onset of complaint: 9am Pain score 3 Vitals:   04/30/19 1519  BP: 125/74  Pulse: 97  Resp: 16  Temp: 98 F (36.7 C)  SpO2: 99%     [id number] Lab orders placed from triage:

## 2019-05-01 DIAGNOSIS — Z369 Encounter for antenatal screening, unspecified: Secondary | ICD-10-CM | POA: Diagnosis not present

## 2019-05-01 DIAGNOSIS — Z3480 Encounter for supervision of other normal pregnancy, unspecified trimester: Secondary | ICD-10-CM | POA: Diagnosis not present

## 2019-05-01 LAB — OB RESULTS CONSOLE GBS: GBS: NEGATIVE

## 2019-05-07 DIAGNOSIS — Z369 Encounter for antenatal screening, unspecified: Secondary | ICD-10-CM | POA: Diagnosis not present

## 2019-05-08 ENCOUNTER — Inpatient Hospital Stay (HOSPITAL_COMMUNITY)
Admission: AD | Admit: 2019-05-08 | Discharge: 2019-05-10 | DRG: 807 | Disposition: A | Discharge status: Home / Self Care | Payer: BC Managed Care – PPO | Attending: Obstetrics and Gynecology | Admitting: Obstetrics and Gynecology

## 2019-05-08 ENCOUNTER — Inpatient Hospital Stay (HOSPITAL_COMMUNITY): Payer: BC Managed Care – PPO | Admitting: Anesthesiology

## 2019-05-08 ENCOUNTER — Other Ambulatory Visit: Payer: Self-pay

## 2019-05-08 ENCOUNTER — Encounter (HOSPITAL_COMMUNITY): Payer: Self-pay | Admitting: *Deleted

## 2019-05-08 DIAGNOSIS — Z87891 Personal history of nicotine dependence: Secondary | ICD-10-CM

## 2019-05-08 DIAGNOSIS — Z6791 Unspecified blood type, Rh negative: Secondary | ICD-10-CM | POA: Diagnosis not present

## 2019-05-08 DIAGNOSIS — O9902 Anemia complicating childbirth: Secondary | ICD-10-CM | POA: Diagnosis not present

## 2019-05-08 DIAGNOSIS — O26893 Other specified pregnancy related conditions, third trimester: Secondary | ICD-10-CM | POA: Diagnosis not present

## 2019-05-08 DIAGNOSIS — Z3A37 37 weeks gestation of pregnancy: Secondary | ICD-10-CM

## 2019-05-08 DIAGNOSIS — Z3490 Encounter for supervision of normal pregnancy, unspecified, unspecified trimester: Secondary | ICD-10-CM

## 2019-05-08 DIAGNOSIS — N898 Other specified noninflammatory disorders of vagina: Secondary | ICD-10-CM

## 2019-05-08 DIAGNOSIS — D649 Anemia, unspecified: Secondary | ICD-10-CM | POA: Diagnosis not present

## 2019-05-08 DIAGNOSIS — Z1159 Encounter for screening for other viral diseases: Secondary | ICD-10-CM | POA: Diagnosis not present

## 2019-05-08 LAB — CBC
HCT: 35.2 % — ABNORMAL LOW (ref 36.0–46.0)
Hemoglobin: 11.1 g/dL — ABNORMAL LOW (ref 12.0–15.0)
MCH: 26.4 pg (ref 26.0–34.0)
MCHC: 31.5 g/dL (ref 30.0–36.0)
MCV: 83.8 fL (ref 80.0–100.0)
Platelets: 131 10*3/uL — ABNORMAL LOW (ref 150–400)
RBC: 4.2 MIL/uL (ref 3.87–5.11)
RDW: 13.6 % (ref 11.5–15.5)
WBC: 11.5 10*3/uL — ABNORMAL HIGH (ref 4.0–10.5)
nRBC: 0 % (ref 0.0–0.2)

## 2019-05-08 LAB — RPR: RPR Ser Ql: NONREACTIVE

## 2019-05-08 LAB — SARS CORONAVIRUS 2 BY RT PCR (HOSPITAL ORDER, PERFORMED IN ~~LOC~~ HOSPITAL LAB): SARS Coronavirus 2: NEGATIVE

## 2019-05-08 LAB — POCT FERN TEST: POCT Fern Test: NEGATIVE

## 2019-05-08 MED ORDER — SODIUM CHLORIDE (PF) 0.9 % IJ SOLN
INTRAMUSCULAR | Status: DC | PRN
  Administered 2019-05-08: 12 mL/h via EPIDURAL

## 2019-05-08 MED ORDER — SOD CITRATE-CITRIC ACID 500-334 MG/5ML PO SOLN: 30.0000 mL | ORAL | Status: DC | PRN

## 2019-05-08 MED ORDER — DIBUCAINE (PERIANAL) 1 % EX OINT: 1.0000 "application " | TOPICAL_OINTMENT | CUTANEOUS | Status: DC | PRN

## 2019-05-08 MED ORDER — ONDANSETRON HCL 4 MG/2ML IJ SOLN: 4.0000 mg | Freq: Four times a day (QID) | INTRAMUSCULAR | Status: DC | PRN

## 2019-05-08 MED ORDER — SENNOSIDES-DOCUSATE SODIUM 8.6-50 MG PO TABS
2.0000 | ORAL_TABLET | ORAL | Status: DC
  Administered 2019-05-08 – 2019-05-09 (×2): 2 via ORAL
  Filled 2019-05-08 (×2): qty 2

## 2019-05-08 MED ORDER — TETANUS-DIPHTH-ACELL PERTUSSIS 5-2.5-18.5 LF-MCG/0.5 IM SUSP: 0.5000 mL | Freq: Once | INTRAMUSCULAR | Status: DC

## 2019-05-08 MED ORDER — DIPHENHYDRAMINE HCL 50 MG/ML IJ SOLN: 12.5000 mg | INTRAMUSCULAR | Status: DC | PRN

## 2019-05-08 MED ORDER — METHYLERGONOVINE MALEATE 0.2 MG PO TABS: 0.2000 mg | ORAL_TABLET | ORAL | Status: DC | PRN

## 2019-05-08 MED ORDER — OXYCODONE-ACETAMINOPHEN 5-325 MG PO TABS
2.0000 | ORAL_TABLET | ORAL | Status: DC | PRN
  Administered 2019-05-09 (×3): 2 via ORAL
  Filled 2019-05-08 (×4): qty 2

## 2019-05-08 MED ORDER — LIDOCAINE HCL (PF) 1 % IJ SOLN: 30.0000 mL | INTRAMUSCULAR | Status: DC | PRN

## 2019-05-08 MED ORDER — LIDOCAINE HCL (PF) 1 % IJ SOLN
INTRAMUSCULAR | Status: DC | PRN
  Administered 2019-05-08: 2 mL via EPIDURAL
  Administered 2019-05-08: 5 mL via EPIDURAL
  Administered 2019-05-08: 3 mL via EPIDURAL

## 2019-05-08 MED ORDER — EPHEDRINE 5 MG/ML INJ: 10.0000 mg | INTRAVENOUS | Status: DC | PRN

## 2019-05-08 MED ORDER — PHENYLEPHRINE 40 MCG/ML (10ML) SYRINGE FOR IV PUSH (FOR BLOOD PRESSURE SUPPORT): 80.0000 ug | PREFILLED_SYRINGE | INTRAVENOUS | Status: DC | PRN

## 2019-05-08 MED ORDER — ALBUTEROL SULFATE (2.5 MG/3ML) 0.083% IN NEBU: 2.5000 mg | INHALATION_SOLUTION | Freq: Four times a day (QID) | RESPIRATORY_TRACT | Status: DC | PRN

## 2019-05-08 MED ORDER — FENTANYL-BUPIVACAINE-NACL 0.5-0.125-0.9 MG/250ML-% EP SOLN
12.0000 mL/h | EPIDURAL | Status: DC | PRN
  Filled 2019-05-08: qty 250

## 2019-05-08 MED ORDER — ONDANSETRON HCL 4 MG/2ML IJ SOLN: 4.0000 mg | INTRAMUSCULAR | Status: DC | PRN

## 2019-05-08 MED ORDER — PHENYLEPHRINE 40 MCG/ML (10ML) SYRINGE FOR IV PUSH (FOR BLOOD PRESSURE SUPPORT)
80.0000 ug | PREFILLED_SYRINGE | INTRAVENOUS | Status: DC | PRN
  Filled 2019-05-08: qty 10

## 2019-05-08 MED ORDER — DIPHENHYDRAMINE HCL 25 MG PO CAPS: 25.0000 mg | ORAL_CAPSULE | Freq: Four times a day (QID) | ORAL | Status: DC | PRN

## 2019-05-08 MED ORDER — OXYTOCIN 40 UNITS IN NORMAL SALINE INFUSION - SIMPLE MED
1.0000 m[IU]/min | INTRAVENOUS | Status: DC
  Administered 2019-05-08: 2 m[IU]/min via INTRAVENOUS
  Filled 2019-05-08: qty 1000

## 2019-05-08 MED ORDER — ZOLPIDEM TARTRATE 5 MG PO TABS: 5.0000 mg | ORAL_TABLET | Freq: Every evening | ORAL | Status: DC | PRN

## 2019-05-08 MED ORDER — IBUPROFEN 600 MG PO TABS
600.0000 mg | ORAL_TABLET | Freq: Four times a day (QID) | ORAL | Status: DC
  Administered 2019-05-08 – 2019-05-10 (×6): 600 mg via ORAL
  Filled 2019-05-08 (×6): qty 1

## 2019-05-08 MED ORDER — OXYCODONE-ACETAMINOPHEN 5-325 MG PO TABS: 1.0000 | ORAL_TABLET | ORAL | Status: DC | PRN

## 2019-05-08 MED ORDER — LACTATED RINGERS IV SOLN: 500.0000 mL | Freq: Once | INTRAVENOUS | Status: DC

## 2019-05-08 MED ORDER — COCONUT OIL OIL: 1.0000 "application " | TOPICAL_OIL | Status: DC | PRN

## 2019-05-08 MED ORDER — OXYTOCIN BOLUS FROM INFUSION
500.0000 mL | Freq: Once | INTRAVENOUS | Status: AC
  Administered 2019-05-08: 500 mL via INTRAVENOUS

## 2019-05-08 MED ORDER — SIMETHICONE 80 MG PO CHEW: 80.0000 mg | CHEWABLE_TABLET | ORAL | Status: DC | PRN

## 2019-05-08 MED ORDER — TERBUTALINE SULFATE 1 MG/ML IJ SOLN: 0.2500 mg | Freq: Once | INTRAMUSCULAR | Status: DC | PRN

## 2019-05-08 MED ORDER — FLEET ENEMA 7-19 GM/118ML RE ENEM: 1.0000 | ENEMA | Freq: Every day | RECTAL | Status: DC | PRN

## 2019-05-08 MED ORDER — ONDANSETRON HCL 4 MG PO TABS: 4.0000 mg | ORAL_TABLET | ORAL | Status: DC | PRN

## 2019-05-08 MED ORDER — OXYCODONE-ACETAMINOPHEN 5-325 MG PO TABS: 2.0000 | ORAL_TABLET | ORAL | Status: DC | PRN

## 2019-05-08 MED ORDER — OXYCODONE-ACETAMINOPHEN 5-325 MG PO TABS
1.0000 | ORAL_TABLET | ORAL | Status: DC | PRN
  Administered 2019-05-10: 1 via ORAL

## 2019-05-08 MED ORDER — LACTATED RINGERS IV SOLN
500.0000 mL | INTRAVENOUS | Status: DC | PRN
  Administered 2019-05-08: 500 mL via INTRAVENOUS

## 2019-05-08 MED ORDER — ACETAMINOPHEN 325 MG PO TABS: 650.0000 mg | ORAL_TABLET | ORAL | Status: DC | PRN

## 2019-05-08 MED ORDER — METHYLERGONOVINE MALEATE 0.2 MG/ML IJ SOLN: 0.2000 mg | INTRAMUSCULAR | Status: DC | PRN

## 2019-05-08 MED ORDER — LACTATED RINGERS IV SOLN
INTRAVENOUS | Status: DC
  Administered 2019-05-08 (×3): via INTRAVENOUS

## 2019-05-08 MED ORDER — WITCH HAZEL-GLYCERIN EX PADS: 1.0000 "application " | MEDICATED_PAD | CUTANEOUS | Status: DC | PRN

## 2019-05-08 MED ORDER — BENZOCAINE-MENTHOL 20-0.5 % EX AERO
1.0000 "application " | INHALATION_SPRAY | CUTANEOUS | Status: DC | PRN
  Administered 2019-05-08 – 2019-05-09 (×2): 1 via TOPICAL
  Filled 2019-05-08 (×2): qty 56

## 2019-05-08 MED ORDER — PRENATAL MULTIVITAMIN CH
1.0000 | ORAL_TABLET | Freq: Every day | ORAL | Status: DC
  Administered 2019-05-09: 1 via ORAL
  Filled 2019-05-08: qty 1

## 2019-05-08 MED ORDER — SODIUM BICARBONATE 8.4 % IV SOLN
INTRAVENOUS | Status: DC | PRN
  Administered 2019-05-08 (×2): 5 mL via EPIDURAL

## 2019-05-08 MED ORDER — OXYTOCIN 40 UNITS IN NORMAL SALINE INFUSION - SIMPLE MED
2.5000 [IU]/h | INTRAVENOUS | Status: DC
  Administered 2019-05-08: 2.5 [IU]/h via INTRAVENOUS

## 2019-05-08 NOTE — Consult Note (Signed)
Neonatology Note:   Attendance at Delivery:    I was asked by Dr. Ross to attend this vacuum-assisted vaginal delivery at 37 4/[redacted] weeks GA. The mother is a G3P2 O neg, GBS neg with an uncomplicated pregnancy. ROM 7 hours before delivery, fluid clear. Infant vigorous with good spontaneous cry and tone. Delayed cord clamping was done. Needed no suctioning. Ap 8/9. Lungs clear to ausc in DR. He does have scalp abrasions at the left and anterior edges of the vacuum extractor site, discussed with parents. Infant is able to remain with his mother for skin to skin time under nursing supervision. Transferred to the care of Pediatrician.   Stacy Oneill C. Jusitn Salsgiver, MD 

## 2019-05-08 NOTE — H&P (Signed)
Stacy Oneill is a 28 y.o. female presenting for labor  28 yo G3P2002 @ 37+4 presents for labor. Her pregnancy has been uncomplicated to this point OB History    Gravida  3   Para  2   Term  2   Preterm  0   AB  0   Living  2     SAB  0   TAB  0   Ectopic  0   Multiple  0   Live Births  2          Past Medical History:  Diagnosis Date  . Asthma    since age 38; uses advair daily, albuterol as needed  . Urinary tract infection    Past Surgical History:  Procedure Laterality Date  . NO PAST SURGERIES     Family History: family history includes Diabetes in an other family member; Hypertension in an other family member. Social History:  reports that she has quit smoking. Her smoking use included cigarettes. She has quit using smokeless tobacco. She reports that she does not drink alcohol or use drugs.     Maternal Diabetes: No Genetic Screening: Normal Maternal Ultrasounds/Referrals: Normal Fetal Ultrasounds or other Referrals:  None Maternal Substance Abuse:  No Significant Maternal Medications:  None Significant Maternal Lab Results:  None Other Comments:  None  ROS History Dilation: 4 Effacement (%): 60 Station: -3 Exam by:: jaton burgess  Blood pressure (!) 106/56, pulse 65, temperature 98.3 F (36.8 C), temperature source Oral, resp. rate (P) 18, weight 78.9 kg, last menstrual period 08/18/2018, SpO2 98 %, unknown if currently breastfeeding. Exam Physical Exam  Prenatal labs: ABO, Rh: --/--/O NEG (07/02 0752) Antibody: POS (07/02 0752) Rubella:  Immune RPR:   NR HBsAg:   Neg HIV:   NR GBS:   NEg  Assessment/Plan: 1) Admit 2) Epidural on request 3) Expectant management   Vanessa Kick 05/08/2019, 9:30 AM

## 2019-05-08 NOTE — MAU Note (Signed)
Pt reports to MAU c/o a big gush of blood when she woke up this morning. Pt is unsure if her water is broke. +fm. Ctx every 3-6 min.

## 2019-05-08 NOTE — Anesthesia Procedure Notes (Signed)
Epidural Patient location during procedure: OB Start time: 05/08/2019 9:00 AM End time: 05/08/2019 9:06 AM  Staffing Anesthesiologist: Suzette Battiest, MD Performed: anesthesiologist   Preanesthetic Checklist Completed: patient identified, site marked, surgical consent, pre-op evaluation, timeout performed, IV checked, risks and benefits discussed and monitors and equipment checked  Epidural Patient position: sitting Prep: site prepped and draped and DuraPrep Patient monitoring: continuous pulse ox and blood pressure Approach: midline Location: L4-L5 Injection technique: LOR air  Needle:  Needle type: Tuohy  Needle gauge: 17 G Needle length: 9 cm and 9 Needle insertion depth: 6 cm Catheter type: closed end flexible Catheter size: 19 Gauge Catheter at skin depth: 11 cm Test dose: negative  Assessment Events: blood not aspirated, injection not painful, no injection resistance, negative IV test and no paresthesia

## 2019-05-08 NOTE — MAU Provider Note (Signed)
Chief Complaint:  Rupture of Membranes, Contractions, and Vaginal Bleeding   First Provider Initiated Contact with Patient 05/08/19 0630     HPI  HPI: Stacy Oneill is a 28 y.o. G3P2002 at 4637w4dwho presents to maternity admissions reporting leaking bloody fluid. She reports good fetal movement, denies LOF, vaginal bleeding, vaginal itching/burning, urinary symptoms, h/a, dizziness, n/v, diarrhea, constipation or fever/chills.  She denies headache, visual changes or RUQ abdominal pain.  RN Note: Pt reports to MAU c/o a big gush of blood when she woke up this morning. Pt is unsure if her water is broke. +fm. Ctx every 3-6 min.   Past Medical History: Past Medical History:  Diagnosis Date  . Asthma    since age 89; uses advair daily, albuterol as needed  . Urinary tract infection     Past obstetric history: OB History  Gravida Para Term Preterm AB Living  3 2 2  0 0 2  SAB TAB Ectopic Multiple Live Births  0 0 0 0 2    # Outcome Date GA Lbr Len/2nd Weight Sex Delivery Anes PTL Lv  3 Current           2 Term 12/06/12 2411w5d 02:48 / 00:18 3095 g M Vag-Spont None  LIV  1 Term     M Vag-Spont EPI N LIV    Past Surgical History: Past Surgical History:  Procedure Laterality Date  . NO PAST SURGERIES      Family History: Family History  Problem Relation Age of Onset  . Hypertension Other   . Diabetes Other   . Other Neg Hx     Social History: Social History   Tobacco Use  . Smoking status: Former Smoker    Types: Cigarettes  . Smokeless tobacco: Former Engineer, waterUser  Substance Use Topics  . Alcohol use: No  . Drug use: No    Comment: quit smoking cigarettes (tobacco) before pregnancy.    Allergies: No Known Allergies  Meds:  Medications Prior to Admission  Medication Sig Dispense Refill Last Dose  . albuterol (PROVENTIL HFA;VENTOLIN HFA) 108 (90 BASE) MCG/ACT inhaler Inhale 2 puffs into the lungs every 6 (six) hours as needed for wheezing or shortness of breath. 1  Inhaler 0 05/07/2019 at Unknown time  . Prenatal Vit-Fe Fumarate-FA (PRENATAL MULTIVITAMIN) TABS tablet Take 1 tablet by mouth daily at 12 noon.   05/07/2019 at Unknown time    I have reviewed patient's Past Medical Hx, Surgical Hx, Family Hx, Social Hx, medications and allergies.   ROS:  Review of Systems  Constitutional: Negative for chills and fever.  Gastrointestinal: Positive for abdominal pain. Negative for constipation, diarrhea and nausea.  Genitourinary: Positive for vaginal bleeding and vaginal discharge.   Other systems negative  Physical Exam   Patient Vitals for the past 24 hrs:  BP Temp Pulse Weight  05/08/19 0555 99/72 98.2 F (36.8 C) 90 78.9 kg   Constitutional: Well-developed, well-nourished female in no acute distress.  Cardiovascular: normal rate and rhythm Respiratory: normal effort, clear to auscultation bilaterally GI: Abd soft, non-tender, gravid appropriate for gestational age.   No rebound or guarding. MS: Extremities nontender, no edema, normal ROM Neurologic: Alert and oriented x 4.  GU: Neg CVAT.  PELVIC EXAM: Cervix pink, visually closed, without lesion, small to moderate bloody mucoid discharge, vaginal walls and external genitalia normal Dilation: 3 Effacement (%): 80 Station: -2 Presentation: Vertex Exam by:: Roxan Hockeyraci Lytle RN   FHT:  Baseline 140 , moderate variability, accelerations present, no  decelerations Contractions: q 3 mins Irregular     Labs: No results found for this or any previous visit (from the past 24 hour(s)).     Imaging:  No results found.  MAU Course/MDM: NST reviewed, reactive.  Treatments in MAU included EFM, SSE.    Assessment: SIUP at [redacted]w[redacted]d Bloody discharge No evidence of ROM Uterine contractions, rule out labor  Plan: RN to continue labor evaluation  Hansel Feinstein CNM, MSN Certified Nurse-Midwife 05/08/2019 6:30 AM

## 2019-05-08 NOTE — Anesthesia Preprocedure Evaluation (Signed)
Anesthesia Evaluation  Patient identified by MRN, date of birth, ID band Patient awake    Reviewed: Allergy & Precautions, Patient's Chart, lab work & pertinent test results  Airway Mallampati: II  TM Distance: >3 FB     Dental   Pulmonary asthma , former smoker,    Pulmonary exam normal        Cardiovascular negative cardio ROS Normal cardiovascular exam     Neuro/Psych negative neurological ROS     GI/Hepatic negative GI ROS, Neg liver ROS,   Endo/Other  negative endocrine ROS  Renal/GU negative Renal ROS     Musculoskeletal   Abdominal   Peds  Hematology  (+) Blood dyscrasia (thrombocytopenia), anemia ,   Anesthesia Other Findings   Reproductive/Obstetrics (+) Pregnancy                             Lab Results  Component Value Date   WBC 11.5 (H) 05/08/2019   HGB 11.1 (L) 05/08/2019   HCT 35.2 (L) 05/08/2019   MCV 83.8 05/08/2019   PLT 131 (L) 05/08/2019    Anesthesia Physical Anesthesia Plan  ASA: II  Anesthesia Plan: Epidural   Post-op Pain Management:    Induction:   PONV Risk Score and Plan: Treatment may vary due to age or medical condition  Airway Management Planned: Natural Airway  Additional Equipment:   Intra-op Plan:   Post-operative Plan:   Informed Consent: I have reviewed the patients History and Physical, chart, labs and discussed the procedure including the risks, benefits and alternatives for the proposed anesthesia with the patient or authorized representative who has indicated his/her understanding and acceptance.       Plan Discussed with:   Anesthesia Plan Comments:         Anesthesia Quick Evaluation

## 2019-05-08 NOTE — Progress Notes (Signed)
   05/08/19 0717  Fetal Heart Rate A  Mode External  Baseline Rate (A) 140 bpm  Variability 6-25 BPM  Accelerations 15 x 15  Decelerations None  Uterine Activity  Mode Toco  Contraction Frequency (min) 2-4  Contraction Duration (sec) 60-100  Contraction Quality Mild;Moderate  Resting Tone Palpated Relaxed  Resting Time Adequate  Cervical Exam  Dilation 4  Effacement (%) 60  Cervical Position Middle  Cervical Consistency Soft  Vag. Bleeding [place] -3  Presentation Vertex  Exam by: Elza Rafter   Above assessment called to Dr Harrington Challenger to include negative ferns, GBS neg, & pt situation.  Orders rec'd to admit to L&D.

## 2019-05-09 LAB — CBC
HCT: 30.4 % — ABNORMAL LOW (ref 36.0–46.0)
Hemoglobin: 9.8 g/dL — ABNORMAL LOW (ref 12.0–15.0)
MCH: 26.7 pg (ref 26.0–34.0)
MCHC: 32.2 g/dL (ref 30.0–36.0)
MCV: 82.8 fL (ref 80.0–100.0)
Platelets: 113 10*3/uL — ABNORMAL LOW (ref 150–400)
RBC: 3.67 MIL/uL — ABNORMAL LOW (ref 3.87–5.11)
RDW: 13.6 % (ref 11.5–15.5)
WBC: 12.7 10*3/uL — ABNORMAL HIGH (ref 4.0–10.5)
nRBC: 0 % (ref 0.0–0.2)

## 2019-05-09 MED ORDER — RHO D IMMUNE GLOBULIN 1500 UNIT/2ML IJ SOSY
300.0000 ug | PREFILLED_SYRINGE | Freq: Once | INTRAMUSCULAR | Status: AC
  Administered 2019-05-09: 300 ug via INTRAVENOUS
  Filled 2019-05-09: qty 2

## 2019-05-09 NOTE — Plan of Care (Signed)
Improving.

## 2019-05-09 NOTE — Anesthesia Postprocedure Evaluation (Signed)
Anesthesia Post Note  Patient: Stacy Oneill  Procedure(s) Performed: AN AD [place]     Patient location during evaluation: Mother Baby Anesthesia Type: Epidural Level of consciousness: awake and awake and alert Pain management: pain level controlled Vital Signs Assessment: post-procedure vital signs reviewed and stable Respiratory status: spontaneous breathing Cardiovascular status: blood pressure returned to baseline Postop Assessment: no headache Anesthetic complications: no    Last Vitals:  Vitals:   05/09/19 0500 05/09/19 0847  BP: 125/71 112/61  Pulse: 67 (!) 56  Resp: 18 18  Temp: 36.6 C 36.6 C  SpO2: 100% 99%    Last Pain:  Vitals:   05/09/19 0935  TempSrc:   PainSc: 5                  Archie Shea

## 2019-05-09 NOTE — Lactation Note (Signed)
This note was copied from a baby's chart. Lactation Consultation Note  Patient Name: Stacy Oneill JSHFW'Y Date: 05/09/2019 Reason for consult: Initial assessment;Early term 37-38.6wks;Other (Comment)(baby is going  for circ)  Baby is 85 hours old , mom experienced breast feeder with some challenges.  Per mom the baby latches well . Felt like he wasn't getting enough and added formula supplementation.  The nurse set up a DEBP and has pumped x 3 since and increased amounts - last pumping was about  10 drops off each breast.  Last feeding was at 1450 - 10 drops of EBM and prior to that formula 20 ml .  LC recommended and encouraged mom to call with feeding cues for feeding assessment.  LC mentioned to mom the baby post circ can be fussy or sleepy, or desire to cluster feed. Its normal.   LC informed of the [facility] resources after D/C at [facility].      Maternal Data Does the patient have breastfeeding experience prior to this delivery?: Yes  Feeding Feeding Type: (recently fed)  LATCH Score                   Interventions Interventions: Breast feeding basics reviewed  Lactation Tools Discussed/Used Tools: Pump;Flanges(per mom the last time pumping 10 drops out of each) Flange Size: 24(per mom comfortable) Breast pump type: Double-Electric Breast Pump WIC Program: No Pump Review: Milk Storage(already  set up)   Consult Status Consult Status: Follow-up Date: 05/09/19 Follow-up type: In-patient    [name] 05/09/2019, 3:31 PM

## 2019-05-09 NOTE — Discharge Summary (Signed)
Obstetric Discharge Summary Reason for Admission: onset of labor Prenatal Procedures: none Intrapartum Procedures: spontaneous vaginal delivery and vacuum Postpartum Procedures: none Complications-Operative and Postpartum: none Hemoglobin  Date Value Ref Range Status  05/09/2019 9.8 (L) 12.0 - 15.0 g/dL Final   HCT  Date Value Ref Range Status  05/09/2019 30.4 (L) 36.0 - 46.0 % Final    Physical Exam:  General: alert, cooperative and appears stated age 28: appropriate Uterine Fundus: firm DVT Evaluation: No evidence of DVT seen on physical exam.  Discharge Diagnoses: Term Pregnancy-delivered  Discharge Information: Date: 05/09/2019 Activity: pelvic rest Diet: routine Medications: PNV, Ibuprofen and oxycodone Condition: stable Instructions: refer to practice specific booklet Discharge to: home Follow-up Information    Vanessa Kick, MD Follow up in 4 week(s).   Specialty: Obstetrics and Gynecology Contact information: [address] [place] [address] [phone]           Newborn Data: Live born female  Birth Weight: 7 lb 3.7 oz (3280 g) APGAR: 8, 9  Newborn Delivery   Birth date/time: 05/08/2019 17:55:00 Delivery type: Vaginal, Vacuum (Extractor)      Home with mother.  [place] 05/09/2019, 1:07 PM

## 2019-05-09 NOTE — Progress Notes (Signed)
Patient is doing well.  She is ambulating, voiding, tolerating PO.  Pain control is good.  Lochia is appropriate  Vitals:   05/08/19 2100 05/09/19 0055 05/09/19 0500 05/09/19 0847  BP: 116/66 104/66 125/71 112/61  Pulse: 76 70 67 (!) 56  Resp: 18 18 18 18   Temp: 98.5 F (36.9 C) 98.2 F (36.8 C) 97.9 F (36.6 C) 97.8 F (36.6 C)  TempSrc: Oral Oral Oral   SpO2: 100% 99% 100% 99%  Weight:      Height:        NAD Fundus firm Ext: no edema  Lab Results  Component Value Date   WBC 12.7 (H) 05/09/2019   HGB 9.8 (L) 05/09/2019   HCT 30.4 (L) 05/09/2019   MCV 82.8 05/09/2019   PLT 113 (L) 05/09/2019    --/--/O NEG (07/03 0452)/RImmune  A/P 28 y.o. G3P3003 PPD#1. Routine care.   Meeting all goals.  Discharge to home today if baby able to be discharged.  Rh neg--rhogam administered today  Desires circumcision. Discussed r/b/a of the procedure. Reviewed that circumcision is an elective surgical procedure and not considered medically necessary. Reviewed the risks of the procedure including the risk of infection, bleeding, damage to surrounding structures, including scrotum, shaft, urethra and head of penis, and an undesired cosmetic effect requiring additional procedures for revision. Consent signed.     [place]

## 2019-05-10 LAB — TYPE AND SCREEN
ABO/RH(D): O NEG
Antibody Screen: POSITIVE
Unit division: 0
Unit division: 0

## 2019-05-10 LAB — RH IG WORKUP (INCLUDES ABO/RH)
ABO/RH(D): O NEG
Fetal Screen: NEGATIVE
Gestational Age(Wks): 37
Unit division: 0

## 2019-05-10 LAB — BPAM RBC
Blood Product Expiration Date: 202007142359
Blood Product Expiration Date: 202007302359
ISSUE DATE / TIME: 202006260930
Unit Type and Rh: 9500
Unit Type and Rh: 9500

## 2019-05-10 MED ORDER — OXYCODONE HCL 5 MG PO TABS: 5.0000 mg | ORAL_TABLET | ORAL | 0 refills | Status: DC | PRN

## 2019-05-10 MED ORDER — IBUPROFEN 600 MG PO TABS: 600.0000 mg | ORAL_TABLET | Freq: Four times a day (QID) | ORAL | 0 refills | Status: DC

## 2019-05-10 NOTE — Lactation Note (Signed)
This note was copied from a baby's chart. Lactation Consultation Note  Patient Name: Boy Darlen Gledhill [id number] Date: 05/10/2019 Reason for consult: Follow-up assessment;Early term 54-38.6wks  P3 mother whose infant is now 81 hours old.  Mother has 2 other children (ages 71 and 34) and breast fed the last child for 6 months.  Baby was asleep in the bed next to mother when I arrived.  She has been supplementing with formula.  Mother stated that he was not content last night and she gave him more formula.  Explained cluster feeding to her and suggested that she continue to feed 8-12 times/24 hours or sooner if he shows feeding cues.  Encouraged her to put baby to breast as often as he desires to help bring in a full milk supply.  Reminded her to do hand expression before/after feeding and pumping.  Mother verbalized understanding.  She is familiar with hand expression and engorgement.  Manual pump provided but mother did not wish to review this pump.  She was familiar with it from a previous pregnancy.  Mother has a DEBP for home use.  She has our OP phone number for questions/concerns after discharge.  Father present.   Maternal Data Formula Feeding for Exclusion: No Has patient been taught Hand Expression?: Yes Does the patient have breastfeeding experience prior to this delivery?: Yes  Feeding Feeding Type: Bottle Fed - Formula Nipple Type: Slow - flow  LATCH Score                   Interventions    Lactation Tools Discussed/Used     Consult Status Consult Status: Complete Date: 05/10/19 Follow-up type: Call as needed    Akyra Bouchie R Calena Salem 05/10/2019, 9:27 AM

## 2019-12-17 ENCOUNTER — Telehealth: Payer: Self-pay | Admitting: Adult Health

## 2019-12-17 NOTE — Telephone Encounter (Signed)

## 2019-12-18 ENCOUNTER — Other Ambulatory Visit: Payer: Self-pay

## 2019-12-18 ENCOUNTER — Ambulatory Visit (INDEPENDENT_AMBULATORY_CARE_PROVIDER_SITE_OTHER): Payer: BC Managed Care – PPO | Admitting: Adult Health

## 2019-12-18 ENCOUNTER — Encounter: Payer: Self-pay | Admitting: Adult Health

## 2019-12-18 VITALS — BP 117/65 | HR 69 | Ht 62.0 in | Wt 151.0 lb

## 2019-12-18 DIAGNOSIS — Z3A01 Less than 8 weeks gestation of pregnancy: Secondary | ICD-10-CM

## 2019-12-18 DIAGNOSIS — Z3201 Encounter for pregnancy test, result positive: Secondary | ICD-10-CM | POA: Diagnosis not present

## 2019-12-18 DIAGNOSIS — O09891 Supervision of other high risk pregnancies, first trimester: Secondary | ICD-10-CM | POA: Diagnosis not present

## 2019-12-18 DIAGNOSIS — O3680X0 Pregnancy with inconclusive fetal viability, not applicable or unspecified: Secondary | ICD-10-CM

## 2019-12-18 LAB — POCT URINE PREGNANCY: Preg Test, Ur: POSITIVE — AB

## 2019-12-18 NOTE — Progress Notes (Signed)
  Subjective:     Patient ID: Stacy Oneill, female   DOB: 30-Sep-1991, 29 y.o.   MRN: [id number]  HPI Stacy Oneill is a 29 year old white female, married in for UPT, has missed a period and had 2+HPTs. She has 3 boys the youngest is 61 months old, delivered at 37+4 weeks by vacuum, and the oldest is 10, and has 29 year old, this was a surprise.  PCP is Dr Cyndia Bent.   Review of Systems +missed period and had 2+HPTs Reviewed past medical,surgical, social and family history. Reviewed medications and allergies.     Objective:   Physical Exam BP 117/65 (BP Location: Left Arm, Patient Position: Sitting, Cuff Size: Normal)   Pulse 69   Ht 5\' 2"  (1.575 m)   Wt 151 lb (68.5 kg)   LMP 11/03/2019   Breastfeeding No   BMI 27.62 kg/m UPT is +, about 6+3 weeks by LMP with EDD 08/12/20. Skin warm and dry. Neck: mid line trachea, normal thyroid, good ROM, no lymphadenopathy noted. Lungs: clear to ausculation bilaterally. Cardiovascular: regular rate and rhythm.Abdomen is soft and non tender Fall risk is low PHQ 2 score is 0.    Assessment:     1. Positive urine pregnancy test  2. Less than [redacted] weeks gestation of pregnancy Take PNV  3. Encounter to determine fetal viability of pregnancy, single or unspecified fetus Dating 10/12/20 in about 2 weeks   4. Short interval between pregnancies affecting pregnancy in first trimester, antepartum    Plan:     Review handout by [facility]

## 2020-01-02 ENCOUNTER — Other Ambulatory Visit: Payer: Self-pay

## 2020-01-02 ENCOUNTER — Ambulatory Visit (INDEPENDENT_AMBULATORY_CARE_PROVIDER_SITE_OTHER): Payer: BC Managed Care – PPO

## 2020-01-02 DIAGNOSIS — Z3A08 8 weeks gestation of pregnancy: Secondary | ICD-10-CM

## 2020-01-02 DIAGNOSIS — O3680X0 Pregnancy with inconclusive fetal viability, not applicable or unspecified: Secondary | ICD-10-CM | POA: Diagnosis not present

## 2020-01-02 NOTE — Progress Notes (Signed)
[place] 8+4 wks single IUP,CRL 22.74 mm,normal ovaries,fhr 176 bpm

## 2020-01-28 ENCOUNTER — Other Ambulatory Visit: Payer: Self-pay | Admitting: Obstetrics and Gynecology

## 2020-01-28 DIAGNOSIS — Z3682 Encounter for antenatal screening for nuchal translucency: Secondary | ICD-10-CM

## 2020-01-29 ENCOUNTER — Ambulatory Visit (INDEPENDENT_AMBULATORY_CARE_PROVIDER_SITE_OTHER): Payer: BC Managed Care – PPO

## 2020-01-29 ENCOUNTER — Ambulatory Visit (INDEPENDENT_AMBULATORY_CARE_PROVIDER_SITE_OTHER): Payer: BC Managed Care – PPO | Admitting: Advanced Practice Midwife

## 2020-01-29 ENCOUNTER — Ambulatory Visit: Payer: BC Managed Care – PPO | Admitting: *Deleted

## 2020-01-29 ENCOUNTER — Other Ambulatory Visit: Payer: Self-pay

## 2020-01-29 ENCOUNTER — Encounter: Payer: Self-pay | Admitting: Advanced Practice Midwife

## 2020-01-29 VITALS — BP 111/72 | HR 86 | Wt 152.0 lb

## 2020-01-29 DIAGNOSIS — Z3A24 24 weeks gestation of pregnancy: Secondary | ICD-10-CM | POA: Diagnosis not present

## 2020-01-29 DIAGNOSIS — Z3682 Encounter for antenatal screening for nuchal translucency: Secondary | ICD-10-CM

## 2020-01-29 DIAGNOSIS — J4530 Mild persistent asthma, uncomplicated: Secondary | ICD-10-CM

## 2020-01-29 DIAGNOSIS — J45909 Unspecified asthma, uncomplicated: Secondary | ICD-10-CM | POA: Insufficient documentation

## 2020-01-29 DIAGNOSIS — Z3481 Encounter for supervision of other normal pregnancy, first trimester: Secondary | ICD-10-CM

## 2020-01-29 DIAGNOSIS — Z3480 Encounter for supervision of other normal pregnancy, unspecified trimester: Secondary | ICD-10-CM | POA: Insufficient documentation

## 2020-01-29 DIAGNOSIS — Z363 Encounter for antenatal screening for malformations: Secondary | ICD-10-CM | POA: Diagnosis not present

## 2020-01-29 DIAGNOSIS — Z3A12 12 weeks gestation of pregnancy: Secondary | ICD-10-CM

## 2020-01-29 DIAGNOSIS — Z1389 Encounter for screening for other disorder: Secondary | ICD-10-CM | POA: Diagnosis not present

## 2020-01-29 DIAGNOSIS — Z3483 Encounter for supervision of other normal pregnancy, third trimester: Secondary | ICD-10-CM | POA: Diagnosis not present

## 2020-01-29 DIAGNOSIS — Z3482 Encounter for supervision of other normal pregnancy, second trimester: Secondary | ICD-10-CM | POA: Diagnosis not present

## 2020-01-29 DIAGNOSIS — Z331 Pregnant state, incidental: Secondary | ICD-10-CM | POA: Diagnosis not present

## 2020-01-29 DIAGNOSIS — Z3A38 38 weeks gestation of pregnancy: Secondary | ICD-10-CM | POA: Diagnosis not present

## 2020-01-29 LAB — POCT URINALYSIS DIPSTICK OB
Blood, UA: NEGATIVE
Glucose, UA: NEGATIVE
Ketones, UA: NEGATIVE
Leukocytes, UA: NEGATIVE
Nitrite, UA: NEGATIVE
POC,PROTEIN,UA: NEGATIVE

## 2020-01-29 MED ORDER — ALBUTEROL SULFATE HFA 108 (90 BASE) MCG/ACT IN AERS: 2.0000 | INHALATION_SPRAY | Freq: Four times a day (QID) | RESPIRATORY_TRACT | 6 refills | Status: DC | PRN

## 2020-01-29 MED ORDER — BLOOD PRESSURE MONITOR MISC: 0 refills | Status: DC

## 2020-01-29 MED ORDER — IPRATROPIUM-ALBUTEROL 20-100 MCG/ACT IN AERS: 1.0000 | INHALATION_SPRAY | Freq: Every day | RESPIRATORY_TRACT | 12 refills | Status: DC

## 2020-01-29 NOTE — Progress Notes (Signed)
[place] 12+3 wks,measurements c/w dates,crl 64.88 mm,normal ovaries,NT 1.6 mm,NB present,fhr 160 bpm,fundal placenta

## 2020-01-29 NOTE — Patient Instructions (Signed)
[name], I greatly value your feedback.  If you receive a survey following your visit with [place] today, we appreciate you taking the time to fill it out.  Thanks, Cathie Beams, DNP, CNM  [facility] HAS MOVED!!! It is now [facility] at [facility] ([address] [place], [place] [address]) Entrance located off of [address] Free 24/7 valet parking   Nausea & Vomiting  Have saltine crackers or pretzels by your bed and eat a few bites before you raise your head out of bed in the morning  Eat small frequent meals throughout the day instead of large meals  Drink plenty of fluids throughout the day to stay hydrated, just don't drink a lot of fluids with your meals.  This can make your stomach fill up faster making you feel sick  Do not brush your teeth right after you eat  Products with real ginger are good for nausea, like ginger ale and ginger hard candy Make sure it says made with real ginger!  Sucking on sour candy like lemon heads is also good for nausea  If your prenatal vitamins make you nauseated, take them at night so you will sleep through the nausea  Sea Bands  If you feel like you need medicine for the nausea & vomiting please let us know  If you are unable to keep any fluids or food down please let us know   Constipation  Drink plenty of fluid, preferably water, throughout the day  Eat foods high in fiber such as fruits, vegetables, and grains  Exercise, such as walking, is a good way to keep your bowels regular  Drink warm fluids, especially warm prune juice, or decaf coffee  Eat a 1/2 cup of real oatmeal (not instant), 1/2 cup applesauce, and 1/2-1 cup warm prune juice every day  If needed, you may take Colace (docusate sodium) stool softener once or twice a day to help keep the stool soft.   If you still are having problems with constipation, you may take Miralax once daily as needed to help keep your bowels regular.    Home Blood Pressure Monitoring for Patients   Your provider has recommended that you check your blood pressure (BP) at least once a week at home. If you do not have a blood pressure cuff at home, one will be provided for you. Contact your provider if you have not received your monitor within 1 week.   Helpful Tips for Accurate Home Blood Pressure Checks  . Don't smoke, exercise, or drink caffeine 30 minutes before checking your BP . Use the restroom before checking your BP (a full bladder can raise your pressure) . Relax in a comfortable upright chair . Feet on the ground . Left arm resting comfortably on a flat surface at the level of your heart . Legs uncrossed . Back supported . Sit quietly and don't talk . Place the cuff on your bare arm . Adjust snuggly, so that only two fingertips can fit between your skin and the top of the cuff . Check 2 readings separated by at least one minute . Keep a log of your BP readings . For a visual, please reference this diagram: http://ccnc.care/bpdiagram  Provider Name: [facility]     Phone: [phone]  Zone 1: ALL CLEAR  Continue to monitor your symptoms:  . BP reading is less than 140 (top number) or less than 90 (bottom number)  . No right upper stomach  pain . No headaches or seeing spots . No feeling nauseated or throwing up . No swelling in face and hands  Zone 2: CAUTION Call your doctor's office for any of the following:  . BP reading is greater than 140 (top number) or greater than 90 (bottom number)  . Stomach pain under your ribs in the middle or right side . Headaches or seeing spots . Feeling nauseated or throwing up . Swelling in face and hands  Zone 3: EMERGENCY  Seek immediate medical care if you have any of the following:  . BP reading is greater than160 (top number) or greater than 110 (bottom number) . Severe headaches not improving with Tylenol . Serious difficulty catching your breath . Any worsening  symptoms from Zone 2    First Trimester of Pregnancy The first trimester of pregnancy is from week 1 until the end of week 12 (months 1 through 3). A week after a sperm fertilizes an egg, the egg will implant on the wall of the uterus. This embryo will begin to develop into a baby. Genes from you and your partner are forming the baby. The female genes determine whether the baby is a boy or a girl. At 6-8 weeks, the eyes and face are formed, and the heartbeat can be seen on ultrasound. At the end of 12 weeks, all the baby's organs are formed.  Now that you are pregnant, you will want to do everything you can to have a healthy baby. Two of the most important things are to get good prenatal care and to follow your health care provider's instructions. Prenatal care is all the medical care you receive before the baby's birth. This care will help prevent, find, and treat any problems during the pregnancy and childbirth. BODY CHANGES Your body goes through many changes during pregnancy. The changes vary from woman to woman.   You may gain or lose a couple of pounds at first.  You may feel sick to your stomach (nauseous) and throw up (vomit). If the vomiting is uncontrollable, call your health care provider.  You may tire easily.  You may develop headaches that can be relieved by medicines approved by your health care provider.  You may urinate more often. Painful urination may mean you have a bladder infection.  You may develop heartburn as a result of your pregnancy.  You may develop constipation because certain hormones are causing the muscles that push waste through your intestines to slow down.  You may develop hemorrhoids or swollen, bulging veins (varicose veins).  Your breasts may begin to grow larger and become tender. Your nipples may stick out more, and the tissue that surrounds them (areola) may become darker.  Your gums may bleed and may be sensitive to brushing and flossing.  Dark  spots or blotches (chloasma, mask of pregnancy) may develop on your face. This will likely fade after the baby is born.  Your menstrual periods will stop.  You may have a loss of appetite.  You may develop cravings for certain kinds of food.  You may have changes in your emotions from day to day, such as being excited to be pregnant or being concerned that something may go wrong with the pregnancy and baby.  You may have more vivid and strange dreams.  You may have changes in your hair. These can include thickening of your hair, rapid growth, and changes in texture. Some women also have hair loss during or after pregnancy, or hair that  feels dry or thin. Your hair will most likely return to normal after your baby is born. WHAT TO EXPECT AT YOUR PRENATAL VISITS During a routine prenatal visit:  You will be weighed to make sure you and the baby are growing normally.  Your blood pressure will be taken.  Your abdomen will be measured to track your baby's growth.  The fetal heartbeat will be listened to starting around week 10 or 12 of your pregnancy.  Test results from any previous visits will be discussed. Your health care provider may ask you:  How you are feeling.  If you are feeling the baby move.  If you have had any abnormal symptoms, such as leaking fluid, bleeding, severe headaches, or abdominal cramping.  If you have any questions. Other tests that may be performed during your first trimester include:  Blood tests to find your blood type and to check for the presence of any previous infections. They will also be used to check for low iron levels (anemia) and Rh antibodies. Later in the pregnancy, blood tests for diabetes will be done along with other tests if problems develop.  Urine tests to check for infections, diabetes, or protein in the urine.  An ultrasound to confirm the proper growth and development of the baby.  An amniocentesis to check for possible genetic  problems.  Fetal screens for spina bifida and Down syndrome.  You may need other tests to make sure you and the baby are doing well. HOME CARE INSTRUCTIONS  Medicines  Follow your health care provider's instructions regarding medicine use. Specific medicines may be either safe or unsafe to take during pregnancy.  Take your prenatal vitamins as directed.  If you develop constipation, try taking a stool softener if your health care provider approves. Diet  Eat regular, well-balanced meals. Choose a variety of foods, such as meat or vegetable-based protein, fish, milk and low-fat dairy products, vegetables, fruits, and whole grain breads and cereals. Your health care provider will help you determine the amount of weight gain that is right for you.  Avoid raw meat and uncooked cheese. These carry germs that can cause birth defects in the baby.  Eating four or five small meals rather than three large meals a day may help relieve nausea and vomiting. If you start to feel nauseous, eating a few soda crackers can be helpful. Drinking liquids between meals instead of during meals also seems to help nausea and vomiting.  If you develop constipation, eat more high-fiber foods, such as fresh vegetables or fruit and whole grains. Drink enough fluids to keep your urine clear or pale yellow. Activity and Exercise  Exercise only as directed by your health care provider. Exercising will help you:  Control your weight.  Stay in shape.  Be prepared for labor and delivery.  Experiencing pain or cramping in the lower abdomen or low back is a good sign that you should stop exercising. Check with your health care provider before continuing normal exercises.  Try to avoid standing for long periods of time. Move your legs often if you must stand in one place for a long time.  Avoid heavy lifting.  Wear low-heeled shoes, and practice good posture.  You may continue to have sex unless your health care  provider directs you otherwise. Relief of Pain or Discomfort  Wear a good support bra for breast tenderness.    Take warm sitz baths to soothe any pain or discomfort caused by hemorrhoids. Use hemorrhoid cream  if your health care provider approves.    Rest with your legs elevated if you have leg cramps or low back pain.  If you develop varicose veins in your legs, wear support hose. Elevate your feet for 15 minutes, 3-4 times a day. Limit salt in your diet. Prenatal Care  Schedule your prenatal visits by the twelfth week of pregnancy. They are usually scheduled monthly at first, then more often in the last 2 months before delivery.  Write down your questions. Take them to your prenatal visits.  Keep all your prenatal visits as directed by your health care provider. Safety  Wear your seat belt at all times when driving.  Make a list of emergency phone numbers, including numbers for family, friends, the hospital, and police and fire departments. General Tips  Ask your health care provider for a referral to a local prenatal education class. Begin classes no later than at the beginning of month 6 of your pregnancy.  Ask for help if you have counseling or nutritional needs during pregnancy. Your health care provider can offer advice or refer you to specialists for help with various needs.  Do not use hot tubs, steam rooms, or saunas.  Do not douche or use tampons or scented sanitary pads.  Do not cross your legs for long periods of time.  Avoid cat litter boxes and soil used by cats. These carry germs that can cause birth defects in the baby and possibly loss of the fetus by miscarriage or stillbirth.  Avoid all smoking, herbs, alcohol, and medicines not prescribed by your health care provider. Chemicals in these affect the formation and growth of the baby.  Schedule a dentist appointment. At home, brush your teeth with a soft toothbrush and be gentle when you floss. SEEK MEDICAL  CARE IF:   You have dizziness.  You have mild pelvic cramps, pelvic pressure, or nagging pain in the abdominal area.  You have persistent nausea, vomiting, or diarrhea.  You have a bad smelling vaginal discharge.  You have pain with urination.  You notice increased swelling in your face, hands, legs, or ankles. SEEK IMMEDIATE MEDICAL CARE IF:   You have a fever.  You are leaking fluid from your vagina.  You have spotting or bleeding from your vagina.  You have severe abdominal cramping or pain.  You have rapid weight gain or loss.  You vomit blood or material that looks like coffee grounds.  You are exposed to [place] measles and have never had them.  You are exposed to fifth disease or chickenpox.  You develop a severe headache.  You have shortness of breath.  You have any kind of trauma, such as from a fall or a car accident. Document Released: 10/17/2001 Document Revised: 03/09/2014 Document Reviewed: 09/02/2013 [facility] Patient Information 2015 [place], [place]. This information is not intended to replace advice given to you by your health care provider. Make sure you discuss any questions you have with your health care provider.  Coronavirus (COVID-19) Are you at risk?  Are you at risk for the Coronavirus (COVID-19)?  To be considered HIGH RISK for Coronavirus (COVID-19), you have to meet the following criteria:  . Traveled to [place], [place], [place], [place] or [place]; or in the [place] to [place], [place], [place], or [place]; and have fever, cough, and shortness of breath within the last 2 weeks of travel OR . Been in close contact with a person diagnosed with COVID-19 within the last 2  weeks and have fever, cough, and shortness of breath . IF YOU DO NOT MEET THESE CRITERIA, YOU ARE CONSIDERED LOW RISK FOR COVID-19.  What to do if you are HIGH RISK for COVID-19?  Marland Kitchen If you are having a medical emergency, call 911. . Seek medical care right away.  Before you go to a doctor's office, urgent care or emergency department, call ahead and tell them about your recent travel, contact with someone diagnosed with COVID-19, and your symptoms. You should receive instructions from your physician's office regarding next steps of care.  . When you arrive at healthcare provider, tell the healthcare staff immediately you have returned from visiting [place], [place], [place], [place] or [place]; or traveled in the [place] to [place], [place], [place], or [place]; in the last two weeks or you have been in close contact with a person diagnosed with COVID-19 in the last 2 weeks.   . Tell the health care staff about your symptoms: fever, cough and shortness of breath. . After you have been seen by a medical provider, you will be either: o Tested for (COVID-19) and discharged home on quarantine except to seek medical care if symptoms worsen, and asked to  - Stay home and avoid contact with others until you get your results (4-5 days)  - Avoid travel on public transportation if possible (such as bus, train, or airplane) or o Sent to the Emergency Department by EMS for evaluation, COVID-19 testing, and possible admission depending on your condition and test results.  What to do if you are LOW RISK for COVID-19?  Reduce your risk of any infection by using the same precautions used for avoiding the common cold or flu:  Marland Kitchen Wash your hands often with soap and warm water for at least 20 seconds.  If soap and water are not readily available, use an alcohol-based hand sanitizer with at least 60% alcohol.  . If coughing or sneezing, cover your mouth and nose by coughing or sneezing into the elbow areas of your shirt or coat, into a tissue or into your sleeve (not your hands). . Avoid shaking hands with others and consider head nods or verbal greetings only. . Avoid touching your eyes, nose, or mouth with unwashed hands.  . Avoid close contact with people who are  sick. . Avoid places or events with large numbers of people in one location, like concerts or sporting events. . Carefully consider travel plans you have or are making. . If you are planning any travel outside or inside the [place], visit the CDC's Travelers' Health webpage for the latest health notices. . If you have some symptoms but not all symptoms, continue to monitor at home and seek medical attention if your symptoms worsen. . If you are having a medical emergency, call 911.   [place] / e-Visit: [email or web address]         [facility]: [facility]: [phone]                   [facility]: [phone]     Safe Medications in Pregnancy   Acne: Benzoyl Peroxide Salicylic Acid  Backache/Headache: Tylenol: 2 regular strength every 4 hours OR              2 Extra strength every 6 hours  Colds/Coughs/Allergies: Benadryl (alcohol free) 25 mg every 6 hours as needed Breath right strips Claritin  Cepacol throat lozenges Chloraseptic throat spray Cold-Eeze- up to three times per day Cough drops, alcohol free Flonase (by prescription only) Guaifenesin Mucinex Robitussin DM (plain only, alcohol free) Saline nasal spray/drops Sudafed (pseudoephedrine) & Actifed ** use only after [redacted] weeks gestation and if you do not have high blood pressure Tylenol Vicks Vaporub Zinc lozenges Zyrtec   Constipation: Colace Ducolax suppositories Fleet enema Glycerin suppositories Metamucil Milk of magnesia Miralax Senokot Smooth move tea  Diarrhea: Kaopectate Imodium A-D  *NO pepto Bismol  Hemorrhoids: Anusol Anusol HC Preparation H Tucks  Indigestion: Tums Maalox Mylanta Zantac  Pepcid  Insomnia: Benadryl (alcohol free) 69m every 6 hours as needed Tylenol PM Unisom, no Gelcaps  Leg  Cramps: Tums MagGel  Nausea/Vomiting:  Bonine Dramamine Emetrol Ginger extract Sea bands Meclizine  Nausea medication to take during pregnancy:  Unisom (doxylamine succinate 25 mg tablets) Take one tablet daily at bedtime. If symptoms are not adequately controlled, the dose can be increased to a maximum recommended dose of two tablets daily (1/2 tablet in the morning, 1/2 tablet mid-afternoon and one at bedtime). Vitamin B6 1035mtablets. Take one tablet twice a day (up to 200 mg per day).  Skin Rashes: Aveeno products Benadryl cream or 2582mvery 6 hours as needed Calamine Lotion 1% cortisone cream  Yeast infection: Gyne-lotrimin 7 Monistat 7   **If taking multiple medications, please check labels to avoid duplicating the same active ingredients **take medication as directed on the label ** Do not exceed 4000 mg of tylenol in 24 hours **Do not take medications that contain aspirin or ibuprofen

## 2020-01-29 NOTE — Progress Notes (Signed)
INITIAL OBSTETRICAL VISIT Patient name: Stacy Oneill MRN [id number]  Date of birth: [birth year] Chief Complaint:   Initial Prenatal Visit  History of Present Illness:   Stacy Oneill is a 29 y.o. G20P3003 Caucasian female at [redacted]w[redacted]d by LMP/8 week [place] with an Estimated Date of Delivery: 08/09/20 being seen today for her initial obstetrical visit.   Her obstetrical history is significant for term SVD w/o problems X 3 (all boys). Last one was a VAD 6/20 , baby delivered in direct OP.Marland Kitchen   Today she reports no complaints.  Patient's last menstrual period was 11/03/2019. Last pap unsure if it was last year or not, will call [facility] Review of Systems:   Pertinent items are noted in HPI Denies cramping/contractions, leakage of fluid, vaginal bleeding, abnormal vaginal discharge w/ itching/odor/irritation, headaches, visual changes, shortness of breath, chest pain, abdominal pain, severe nausea/vomiting, or problems with urination or bowel movements unless otherwise stated above.  Pertinent History Reviewed:  Reviewed past medical,surgical, social, obstetrical and family history.  Reviewed problem list, medications and allergies. OB History  Gravida Para Term Preterm AB Living  4 3 3  0 0 3  SAB TAB Ectopic Multiple Live Births  0 0 0 0 3    # Outcome Date GA Lbr Len/2nd Weight Sex Delivery Anes PTL Lv  4 Current           3 Term 05/08/19 [redacted]w[redacted]d  7 lb 3.7 oz (3.28 kg) M Vag-Vacuum EPI  LIV     Birth Comments: delivered direct OP  2 Term 12/06/12 [redacted]w[redacted]d 02:48 / 00:18 6 lb 13.2 oz (3.095 kg) M Vag-Spont None  LIV  1 Term 11/29/09 [redacted]w[redacted]d  6 lb 11 oz (3.033 kg) M Vag-Spont EPI N LIV   Physical Assessment:   Vitals:   01/29/20 0942  BP: 111/72  Pulse: 86  Weight: 152 lb (68.9 kg)  Body mass index is 27.8 kg/m.       Physical Examination:  General appearance - well appearing, and in no distress  Mental status - alert, oriented to person, place, and time  Psych:  She has a normal mood  and affect  Skin - warm and dry, normal color, no suspicious lesions noted  Chest - effort normal, all lung fields clear to auscultation bilaterally  Heart - normal rate and regular rhythm  Abdomen - soft, nontender  Extremities:  No swelling or varicosities noted      via [place] 12+3 wks,measurements c/w dates,crl 64.88 mm,normal ovaries,NT 1.6 mm,NB present,fhr 160 bpm,fundal placenta   Results for orders placed or performed in visit on 01/29/20 (from the past 24 hour(s))  POC Urinalysis Dipstick OB   Collection Time: 01/29/20  9:59 AM  Result Value Ref Range   Color, UA     Clarity, UA     Glucose, UA Negative Negative   Bilirubin, UA     Ketones, UA neg    Spec Grav, UA     Blood, UA neg    pH, UA     POC,PROTEIN,UA Negative Negative, Trace, Small (1+), Moderate (2+), Large (3+), 4+   Urobilinogen, UA     Nitrite, UA neg    Leukocytes, UA Negative Negative   Appearance     Odor      Assessment & Plan:  1) Low-Risk Pregnancy [id number] at [redacted]w[redacted]d with an Estimated Date of Delivery: 08/09/20   2) Initial OB visit  3) Asthma:  Continue inhaler PRN/ change Advir to Combivent  Meds:  Meds ordered this encounter  Medications  . Blood Pressure Monitor MISC    Sig: For regular home bp monitoring during pregnancy    Dispense:  1 each    Refill:  0    Z34.80    Initial labs obtained Continue prenatal vitamins Reviewed n/v relief measures and warning s/s to report Reviewed recommended weight gain based on pre-gravid BMI Encouraged well-balanced diet Watched video for carrier screening/genetic testing:  Genetic Screening discussed First Screen and Integrated Screen: requested Cystic fibrosis screening requested SMA screening requested Fragile X screening requested Ultrasound discussed; fetal survey: requested CCNC completed  Follow-up: Return in about 3 weeks (around 02/19/2020) for OB Mychart visit.   Orders Placed This Encounter  Procedures  . Urine Culture  .  GC/Chlamydia Probe Amp  . Integrated 1  . Obstetric Panel, Including HIV  . Hepatitis C antibody  . Hgb Fractionation Cascade  . MaterniT 21 plus Core, Blood  . Pain Management Screening Profile (10S)  . POC Urinalysis Dipstick OB    Jacklyn Shell DNP, CNM 01/29/2020 10:24 AM

## 2020-01-30 ENCOUNTER — Other Ambulatory Visit: Payer: BC Managed Care – PPO

## 2020-01-30 ENCOUNTER — Ambulatory Visit: Payer: BC Managed Care – PPO | Admitting: *Deleted

## 2020-01-30 LAB — PMP SCREEN PROFILE (10S), URINE
Amphetamine Scrn, Ur: NEGATIVE ng/mL
BARBITURATE SCREEN URINE: NEGATIVE ng/mL
BENZODIAZEPINE SCREEN, URINE: NEGATIVE ng/mL
CANNABINOIDS UR QL SCN: NEGATIVE ng/mL
Cocaine (Metab) Scrn, Ur: NEGATIVE ng/mL
Creatinine(Crt), U: 89.2 mg/dL (ref 20.0–300.0)
Methadone Screen, Urine: NEGATIVE ng/mL
OXYCODONE+OXYMORPHONE UR QL SCN: NEGATIVE ng/mL
Opiate Scrn, Ur: NEGATIVE ng/mL
Ph of Urine: 7.2 (ref 4.5–8.9)
Phencyclidine Qn, Ur: NEGATIVE ng/mL
Propoxyphene Scrn, Ur: NEGATIVE ng/mL

## 2020-01-31 LAB — URINE CULTURE

## 2020-02-01 LAB — GC/CHLAMYDIA PROBE AMP
Chlamydia trachomatis, NAA: NEGATIVE
Neisseria Gonorrhoeae by PCR: NEGATIVE

## 2020-02-04 LAB — INTEGRATED 1
Crown Rump Length: 64.9 mm
Gest. Age on Collection Date: 12.7 weeks
Maternal Age at EDD: 29.3 yr
Nuchal Translucency (NT): 1.6 mm
Number of Fetuses: 1
PAPP-A Value: 1007.4 ng/mL
Weight: 152 [lb_av]

## 2020-02-04 LAB — OBSTETRIC PANEL, INCLUDING HIV
Antibody Screen: NEGATIVE
Basophils Absolute: 0.1 10*3/uL (ref 0.0–0.2)
Basos: 1 %
EOS (ABSOLUTE): 0.3 10*3/uL (ref 0.0–0.4)
Eos: 3 %
HIV Screen 4th Generation wRfx: NONREACTIVE
Hematocrit: 36.5 % (ref 34.0–46.6)
Hemoglobin: 12.3 g/dL (ref 11.1–15.9)
Hepatitis B Surface Ag: NEGATIVE
Immature Grans (Abs): 0 10*3/uL (ref 0.0–0.1)
Immature Granulocytes: 0 %
Lymphocytes Absolute: 1.7 10*3/uL (ref 0.7–3.1)
Lymphs: 18 %
MCH: 27.3 pg (ref 26.6–33.0)
MCHC: 33.7 g/dL (ref 31.5–35.7)
MCV: 81 fL (ref 79–97)
Monocytes Absolute: 0.5 10*3/uL (ref 0.1–0.9)
Monocytes: 5 %
Neutrophils Absolute: 7.3 10*3/uL — ABNORMAL HIGH (ref 1.4–7.0)
Neutrophils: 73 %
Platelets: 201 10*3/uL (ref 150–450)
RBC: 4.5 x10E6/uL (ref 3.77–5.28)
RDW: 13.6 % (ref 11.7–15.4)
RPR Ser Ql: NONREACTIVE
Rh Factor: NEGATIVE
Rubella Antibodies, IGG: 1.25 index (ref >0.99)
WBC: 9.9 10*3/uL (ref 3.4–10.8)

## 2020-02-04 LAB — HGB FRACTIONATION CASCADE
Hgb A2: 2.3 % (ref 1.8–3.2)
Hgb A: 97.7 % (ref 96.4–98.8)
Hgb F: 0 % (ref 0.0–2.0)
Hgb S: 0 %

## 2020-02-04 LAB — MATERNIT 21 PLUS CORE, BLOOD
Fetal Fraction: 10
Result (T21): NEGATIVE
Trisomy 13 (Patau syndrome): NEGATIVE
Trisomy 18 (Edwards syndrome): NEGATIVE
Trisomy 21 (Down syndrome): NEGATIVE

## 2020-02-04 LAB — HEPATITIS C ANTIBODY: Hep C Virus Ab: <0.1 s/co ratio (ref 0.0–0.9)

## 2020-02-19 ENCOUNTER — Other Ambulatory Visit: Payer: Self-pay

## 2020-02-19 ENCOUNTER — Encounter: Payer: BC Managed Care – PPO | Admitting: Women's Health

## 2020-02-19 DIAGNOSIS — Z3480 Encounter for supervision of other normal pregnancy, unspecified trimester: Secondary | ICD-10-CM | POA: Insufficient documentation

## 2020-02-20 NOTE — Progress Notes (Signed)
Pt not seen This encounter was created in error - please disregard. This encounter was created in error - please disregard.

## 2020-02-26 ENCOUNTER — Other Ambulatory Visit: Payer: Self-pay | Admitting: Adult Health

## 2020-02-27 ENCOUNTER — Other Ambulatory Visit: Payer: Self-pay | Admitting: Adult Health

## 2020-02-27 MED ORDER — BUDESONIDE-FORMOTEROL FUMARATE 160-4.5 MCG/ACT IN AERO: 2.0000 | INHALATION_SPRAY | Freq: Two times a day (BID) | RESPIRATORY_TRACT | 1 refills | Status: DC | PRN

## 2020-02-27 NOTE — Progress Notes (Signed)
Will rx symibcort, dicussed with Dr Emelda Fear.

## 2020-03-03 ENCOUNTER — Encounter: Payer: BC Managed Care – PPO | Admitting: Women's Health

## 2020-03-15 ENCOUNTER — Other Ambulatory Visit: Payer: Self-pay

## 2020-03-15 ENCOUNTER — Ambulatory Visit (INDEPENDENT_AMBULATORY_CARE_PROVIDER_SITE_OTHER): Payer: 59 | Admitting: Obstetrics and Gynecology

## 2020-03-15 VITALS — BP 107/65 | HR 66 | Wt 155.2 lb

## 2020-03-15 DIAGNOSIS — Z3A19 19 weeks gestation of pregnancy: Secondary | ICD-10-CM

## 2020-03-15 DIAGNOSIS — Z3482 Encounter for supervision of other normal pregnancy, second trimester: Secondary | ICD-10-CM

## 2020-03-15 DIAGNOSIS — Z1389 Encounter for screening for other disorder: Secondary | ICD-10-CM

## 2020-03-15 DIAGNOSIS — Z1379 Encounter for other screening for genetic and chromosomal anomalies: Secondary | ICD-10-CM

## 2020-03-15 DIAGNOSIS — Z331 Pregnant state, incidental: Secondary | ICD-10-CM

## 2020-03-15 LAB — POCT URINALYSIS DIPSTICK OB
Blood, UA: NEGATIVE
Glucose, UA: NEGATIVE
Ketones, UA: NEGATIVE
Leukocytes, UA: NEGATIVE
Nitrite, UA: NEGATIVE
POC,PROTEIN,UA: NEGATIVE

## 2020-03-15 NOTE — Progress Notes (Signed)
PATIENT ID: Stacy Oneill, female     DOB: 1991/06/04, 29 y.o.     MRN: [id number]    LOW-RISK PREGNANCY VISIT PATIENT NAME: Stacy Oneill     MRN [id number]     DOB: [birth year]  Chief Complaint:   Gynecologic Exam   History of Present Illness:   Stacy Oneill is a 29 y.o. [phone] female at [redacted]w[redacted]d with an Estimated Date of Delivery: 08/09/20 being seen today for ongoing management of a low-risk pregnancy.   Depression screen [facility] 2/9 01/29/2020 12/18/2019  Decreased Interest 0 0  Down, Depressed, Hopeless 0 0  PHQ - 2 Score 0 0  Altered sleeping 0 -  Tired, decreased energy 2 -  Change in appetite 1 -  Feeling bad or failure about yourself  0 -  Trouble concentrating 0 -  Moving slowly or fidgety/restless 0 -  Suicidal thoughts 0 -  PHQ-9 Score 3 -  Difficult doing work/chores Not difficult at all -   Today she reports no complaints. Contractions: Not present. Vag. Bleeding: None.  Movement: Present. denies leaking of fluid.  Review of Systems:   Pertinent items are noted in HPI Denies abnormal vaginal discharge w/ itching/odor/irritation, headaches, visual changes, shortness of breath, chest pain, abdominal pain, severe nausea/vomiting, or problems with urination or bowel movements unless otherwise stated above.  Pertinent History Reviewed:  Reviewed past medical,surgical, social, obstetrical and family history.  Reviewed problem list, medications and allergies.  Physical Assessment:   Vitals:   03/15/20 1530  BP: 107/65  Pulse: 66  Weight: 155 lb 3.2 oz (70.4 kg)  Body mass index is 28.39 kg/m.        Physical Examination:   General appearance: Well appearing, and in no distress  Mental status: Alert, oriented to person, place, and time  Skin: Warm & dry  Cardiovascular: Normal heart rate noted  Respiratory: Normal respiratory effort, no distress  Abdomen: Soft, gravid, nontender   Baby: 143 bpm  Pelvic: Cervical exam deferred         Extremities:  Edema: None  Fetal Status:     Movement: Present    Chaperone: Biomedical scientist    Results for orders placed or performed in visit on 03/15/20 (from the past 24 hour(s))  POC Urinalysis Dipstick OB   Collection Time: 03/15/20  3:28 PM  Result Value Ref Range   Color, UA     Clarity, UA     Glucose, UA Negative Negative   Bilirubin, UA     Ketones, UA n    Spec Grav, UA     Blood, UA n    pH, UA     POC,PROTEIN,UA Negative Negative, Trace, Small (1+), Moderate (2+), Large (3+), 4+   Urobilinogen, UA     Nitrite, UA n    Leukocytes, UA Negative Negative   Appearance     Odor      Assessment & Plan:  1) Low-risk pregnancy G4P3003 at [redacted]w[redacted]d with an Estimated Date of Delivery: 08/09/20   2) Birth control options discussed: Reviewed all forms of birth control options available including abstinence; over the counter/barrier methods; depo, nuva ring, Nexplanon, IUD, OCP and patch.   Risks/benefits/side effects of each discussed. Permanent sterilization options including the various tubal sterilization modalities. Questions were answered.  PT INCLINED FOR IUD USE   Meds: No orders of the defined types were placed in this encounter.  Labs/procedures today: u/a  Plan:  Continue routine obstetrical care  Next visit: prefers  online    Reviewed: Term labor symptoms and general obstetric precautions including but not limited to vaginal bleeding, contractions, leaking of fluid and fetal movement were reviewed in detail with the patient.  All questions were answered. Ordered home bp cuff. Rx faxed to [facility] already. Check bp weekly, let us know if >140/90.   Follow-up: No follow-ups on file.  By signing my name below, I, [employer], attest that this documentation has been prepared under the direction and in the presence of Eure, Amaryllis Dyke, MD. Electronically Signed: Mal Misty Medical Scribe. 03/15/20. 4:01 PM.  I personally performed the services described in this documentation, which  was SCRIBED in my presence. The recorded information has been reviewed and considered accurate. It has been edited as necessary during review. Tilda Burrow, MD

## 2020-03-17 LAB — INTEGRATED 2
AFP MoM: 1.54
Alpha-Fetoprotein: 76.6 ng/mL
Crown Rump Length: 64.9 mm
DIA MoM: 1.31
DIA Value: 219.7 pg/mL
Estriol, Unconjugated: 2.02 ng/mL
Gest. Age on Collection Date: 12.7 weeks
Gestational Age: 19.3 weeks
Maternal Age at EDD: 29.3 yr
Nuchal Translucency (NT): 1.6 mm
Nuchal Translucency MoM: 1.1
Number of Fetuses: 1
PAPP-A MoM: 0.94
PAPP-A Value: 1007.4 ng/mL
Test Results:: NEGATIVE
Weight: 152 [lb_av]
Weight: 152 [lb_av]
hCG MoM: 0.71
hCG Value: 15.8 IU/mL
uE3 MoM: 1.08

## 2020-03-20 ENCOUNTER — Other Ambulatory Visit: Payer: Self-pay | Admitting: Adult Health

## 2020-04-04 ENCOUNTER — Other Ambulatory Visit: Payer: Self-pay | Admitting: Adult Health

## 2020-04-13 ENCOUNTER — Ambulatory Visit (INDEPENDENT_AMBULATORY_CARE_PROVIDER_SITE_OTHER): Payer: 59 | Admitting: Women's Health

## 2020-04-13 ENCOUNTER — Encounter: Payer: Self-pay | Admitting: Women's Health

## 2020-04-13 VITALS — BP 106/65 | HR 77 | Wt 161.2 lb

## 2020-04-13 DIAGNOSIS — Z331 Pregnant state, incidental: Secondary | ICD-10-CM

## 2020-04-13 DIAGNOSIS — Z363 Encounter for antenatal screening for malformations: Secondary | ICD-10-CM

## 2020-04-13 DIAGNOSIS — Z1389 Encounter for screening for other disorder: Secondary | ICD-10-CM

## 2020-04-13 DIAGNOSIS — Z3480 Encounter for supervision of other normal pregnancy, unspecified trimester: Secondary | ICD-10-CM

## 2020-04-13 DIAGNOSIS — Z3482 Encounter for supervision of other normal pregnancy, second trimester: Secondary | ICD-10-CM

## 2020-04-13 DIAGNOSIS — Z3A23 23 weeks gestation of pregnancy: Secondary | ICD-10-CM

## 2020-04-13 LAB — POCT URINALYSIS DIPSTICK OB
Blood, UA: NEGATIVE
Glucose, UA: NEGATIVE
Ketones, UA: NEGATIVE
Leukocytes, UA: NEGATIVE
Nitrite, UA: NEGATIVE

## 2020-04-13 NOTE — Patient Instructions (Signed)
[name], I greatly value your feedback.  If you receive a survey following your visit with [place] today, we appreciate you taking the time to fill it out.  Thanks, [name], CNM, WHNP-BC   You will have your sugar test next visit.  Please do not eat or drink anything after midnight the night before you come, not even water.  You will be here for at least two hours.  Please make an appointment online for the bloodwork at [email or web address] for 8:30am (or as close to this as possible). Make sure you select the [facility] service center. The day of the appointment, check in with our office first, then you will go to [facility] to start the sugar test.    [facility] [place], [place] [address]) Entrance C, located off of [address] valet parking  Go to [email or web address] to register for FREE online childbirth classes   Call the office [phone]) or go to [facility] if:  You begin to have strong, frequent contractions  Your water breaks.  Sometimes it is a big gush of fluid, sometimes it is just a trickle that keeps getting your panties wet or running down your legs  You have vaginal bleeding.  It is normal to have a small amount of spotting if your cervix was checked.   You don't feel your baby moving like normal.  If you don't, get you something to eat and drink and lay down and focus on feeling your baby move.   If your baby is still not moving like normal, you should call the office or go to [facility].  [place] Pediatricians/Family Doctors:  [facility] [phone]            [facility] [phone]                 [facility] [phone] (usually not accepting new patients unless you have family there already, you are always welcome to call and ask)       [facility] [phone]       [facility] Pediatricians/Family Doctors:   [facility]:  [phone]  [facility]: [phone]  [facility]: [phone]  [facility] Doctors:   [facility]: [phone]   [facility]: [phone]  [facility] Doctors:  [facility]: [phone]   Home Blood Pressure Monitoring for Patients   Your provider has recommended that you check your blood pressure (BP) at least once a week at home. If you do not have a blood pressure cuff at home, one will be provided for you. Contact your provider if you have not received your monitor within 1 week.   Helpful Tips for Accurate Home Blood Pressure Checks  . Don't smoke, exercise, or drink caffeine 30 minutes before checking your BP . Use the restroom before checking your BP (a full bladder can raise your pressure) . Relax in a comfortable upright chair . Feet on the ground . Left arm resting comfortably on a flat surface at the level of your heart . Legs uncrossed . Back supported . Sit quietly and don't talk . Place the cuff on your bare arm . Adjust snuggly, so that only two fingertips can fit between your skin and the top of the cuff . Check 2 readings separated by at least one minute . Keep a log of your BP readings . For a visual, please  reference this diagram: http://ccnc.care/bpdiagram  Provider Name: [facility]     Phone: [phone]  Zone 1: ALL CLEAR  Continue to monitor your symptoms:  . BP reading is less than 140 (top number) or less than 90 (bottom number)  . No right upper stomach pain . No headaches or seeing spots . No feeling nauseated or throwing up . No swelling in face and hands  Zone 2: CAUTION Call your doctor's office for any of the following:  . BP reading is greater than 140 (top number) or greater than 90 (bottom number)  . Stomach pain under your ribs in the middle or right side . Headaches or seeing spots . Feeling nauseated or throwing up . Swelling in  face and hands  Zone 3: EMERGENCY  Seek immediate medical care if you have any of the following:  . BP reading is greater than160 (top number) or greater than 110 (bottom number) . Severe headaches not improving with Tylenol . Serious difficulty catching your breath . Any worsening symptoms from Zone 2   Second Trimester of Pregnancy The second trimester is from week 13 through week 28, months 4 through 6. The second trimester is often a time when you feel your best. Your body has also adjusted to being pregnant, and you begin to feel better physically. Usually, morning sickness has lessened or quit completely, you may have more energy, and you may have an increase in appetite. The second trimester is also a time when the fetus is growing rapidly. At the end of the sixth month, the fetus is about 9 inches long and weighs about 1 pounds. You will likely begin to feel the baby move (quickening) between 18 and 20 weeks of the pregnancy. BODY CHANGES Your body goes through many changes during pregnancy. The changes vary from woman to woman.   Your weight will continue to increase. You will notice your lower abdomen bulging out.  You may begin to get stretch marks on your hips, abdomen, and breasts.  You may develop headaches that can be relieved by medicines approved by your health care provider.  You may urinate more often because the fetus is pressing on your bladder.  You may develop or continue to have heartburn as a result of your pregnancy.  You may develop constipation because certain hormones are causing the muscles that push waste through your intestines to slow down.  You may develop hemorrhoids or swollen, bulging veins (varicose veins).  You may have back pain because of the weight gain and pregnancy hormones relaxing your joints between the bones in your pelvis and as a result of a shift in weight and the muscles that support your balance.  Your breasts will continue to grow  and be tender.  Your gums may bleed and may be sensitive to brushing and flossing.  Dark spots or blotches (chloasma, mask of pregnancy) may develop on your face. This will likely fade after the baby is born.  A dark line from your belly button to the pubic area (linea nigra) may appear. This will likely fade after the baby is born.  You may have changes in your hair. These can include thickening of your hair, rapid growth, and changes in texture. Some women also have hair loss during or after pregnancy, or hair that feels dry or thin. Your hair will most likely return to normal after your baby is born. WHAT TO EXPECT AT YOUR PRENATAL VISITS During a routine prenatal visit:  You  will be weighed to make sure you and the fetus are growing normally.  Your blood pressure will be taken.  Your abdomen will be measured to track your baby's growth.  The fetal heartbeat will be listened to.  Any test results from the previous visit will be discussed. Your health care provider may ask you:  How you are feeling.  If you are feeling the baby move.  If you have had any abnormal symptoms, such as leaking fluid, bleeding, severe headaches, or abdominal cramping.  If you have any questions. Other tests that may be performed during your second trimester include:  Blood tests that check for:  Low iron levels (anemia).  Gestational diabetes (between 24 and 28 weeks).  Rh antibodies.  Urine tests to check for infections, diabetes, or protein in the urine.  An ultrasound to confirm the proper growth and development of the baby.  An amniocentesis to check for possible genetic problems.  Fetal screens for spina bifida and Down syndrome. HOME CARE INSTRUCTIONS   Avoid all smoking, herbs, alcohol, and unprescribed drugs. These chemicals affect the formation and growth of the baby.  Follow your health care provider's instructions regarding medicine use. There are medicines that are either  safe or unsafe to take during pregnancy.  Exercise only as directed by your health care provider. Experiencing uterine cramps is a good sign to stop exercising.  Continue to eat regular, healthy meals.  Wear a good support bra for breast tenderness.  Do not use hot tubs, steam rooms, or saunas.  Wear your seat belt at all times when driving.  Avoid raw meat, uncooked cheese, cat litter boxes, and soil used by cats. These carry germs that can cause birth defects in the baby.  Take your prenatal vitamins.  Try taking a stool softener (if your health care provider approves) if you develop constipation. Eat more high-fiber foods, such as fresh vegetables or fruit and whole grains. Drink plenty of fluids to keep your urine clear or pale yellow.  Take warm sitz baths to soothe any pain or discomfort caused by hemorrhoids. Use hemorrhoid cream if your health care provider approves.  If you develop varicose veins, wear support hose. Elevate your feet for 15 minutes, 3-4 times a day. Limit salt in your diet.  Avoid heavy lifting, wear low heel shoes, and practice good posture.  Rest with your legs elevated if you have leg cramps or low back pain.  Visit your dentist if you have not gone yet during your pregnancy. Use a soft toothbrush to brush your teeth and be gentle when you floss.  A sexual relationship may be continued unless your health care provider directs you otherwise.  Continue to go to all your prenatal visits as directed by your health care provider. SEEK MEDICAL CARE IF:   You have dizziness.  You have mild pelvic cramps, pelvic pressure, or nagging pain in the abdominal area.  You have persistent nausea, vomiting, or diarrhea.  You have a bad smelling vaginal discharge.  You have pain with urination. SEEK IMMEDIATE MEDICAL CARE IF:   You have a fever.  You are leaking fluid from your vagina.  You have spotting or bleeding from your vagina.  You have severe  abdominal cramping or pain.  You have rapid weight gain or loss.  You have shortness of breath with chest pain.  You notice sudden or extreme swelling of your face, hands, ankles, feet, or legs.  You have not felt your baby move  in over an hour.  You have severe headaches that do not go away with medicine.  You have vision changes. Document Released: 10/17/2001 Document Revised: 10/28/2013 Document Reviewed: 12/24/2012 [facility] Patient Information 2015 [place], [place]. This information is not intended to replace advice given to you by your health care provider. Make sure you discuss any questions you have with your health care provider.

## 2020-04-13 NOTE — Progress Notes (Signed)
LOW-RISK PREGNANCY VISIT Patient name: Stacy Oneill MRN [id number]  Date of birth: 19-Jun-1991 Chief Complaint:   Routine Prenatal Visit  History of Present Illness:   Stacy Oneill is a 29 y.o. [phone] female at [redacted]w[redacted]d with an Estimated Date of Delivery: 08/09/20 being seen today for ongoing management of a low-risk pregnancy.  Depression screen [facility] 2/9 01/29/2020 12/18/2019  Decreased Interest 0 0  Down, Depressed, Hopeless 0 0  PHQ - 2 Score 0 0  Altered sleeping 0 -  Tired, decreased energy 2 -  Change in appetite 1 -  Feeling bad or failure about yourself  0 -  Trouble concentrating 0 -  Moving slowly or fidgety/restless 0 -  Suicidal thoughts 0 -  PHQ-9 Score 3 -  Difficult doing work/chores Not difficult at all -    Today she reports no complaints. Missed some appts d/t work schedule. Needs anatomy u/s.  Contractions: Not present.  .  Movement: Present. denies leaking of fluid. Review of Systems:   Pertinent items are noted in HPI Denies abnormal vaginal discharge w/ itching/odor/irritation, headaches, visual changes, shortness of breath, chest pain, abdominal pain, severe nausea/vomiting, or problems with urination or bowel movements unless otherwise stated above. Pertinent History Reviewed:  Reviewed past medical,surgical, social, obstetrical and family history.  Reviewed problem list, medications and allergies. Physical Assessment:   Vitals:   04/13/20 1558  BP: 106/65  Pulse: 77  Weight: 161 lb 3.2 oz (73.1 kg)  Body mass index is 29.48 kg/m.        Physical Examination:   General appearance: Well appearing, and in no distress  Mental status: Alert, oriented to person, place, and time  Skin: Warm & dry  Cardiovascular: Normal heart rate noted  Respiratory: Normal respiratory effort, no distress  Abdomen: Soft, gravid, nontender  Pelvic: Cervical exam deferred         Extremities: Edema: None  Fetal Status: Fetal Heart Rate (bpm): 154 Fundal Height:  25 cm Movement: Present    Chaperone: n/a    Results for orders placed or performed in visit on 04/13/20 (from the past 24 hour(s))  POC Urinalysis Dipstick OB   Collection Time: 04/13/20  4:03 PM  Result Value Ref Range   Color, UA     Clarity, UA     Glucose, UA Negative Negative   Bilirubin, UA     Ketones, UA neg    Spec Grav, UA     Blood, UA neg    pH, UA     POC,PROTEIN,UA Trace Negative, Trace, Small (1+), Moderate (2+), Large (3+), 4+   Urobilinogen, UA     Nitrite, UA neg    Leukocytes, UA Negative Negative   Appearance     Odor      Assessment & Plan:  1) Low-risk pregnancy G4P3003 at [redacted]w[redacted]d with an Estimated Date of Delivery: 08/09/20    Meds: No orders of the defined types were placed in this encounter.  Labs/procedures today: none  Plan:  Continue routine obstetrical care  Next visit: prefers will be in person for anatomy u/s, then pn2    Reviewed: Preterm labor symptoms and general obstetric precautions including but not limited to vaginal bleeding, contractions, leaking of fluid and fetal movement were reviewed in detail with the patient.  All questions were answered.  Follow-up: Return for 1st available anatomy u/s (no visit), then 4wks from now for [place] w/ PN2 w/ CNM in person. Sign release for pap records from [facility] [place]  Orders Placed This Encounter  Procedures  . US OB Comp + 14 Wk  . POC Urinalysis Dipstick OB   Cheral Marker CNM, [facility] 04/13/2020 4:32 PM

## 2020-04-22 ENCOUNTER — Ambulatory Visit (INDEPENDENT_AMBULATORY_CARE_PROVIDER_SITE_OTHER): Payer: BC Managed Care – PPO

## 2020-04-22 DIAGNOSIS — Z3A24 24 weeks gestation of pregnancy: Secondary | ICD-10-CM | POA: Diagnosis not present

## 2020-04-22 DIAGNOSIS — Z363 Encounter for antenatal screening for malformations: Secondary | ICD-10-CM

## 2020-04-22 DIAGNOSIS — Z3482 Encounter for supervision of other normal pregnancy, second trimester: Secondary | ICD-10-CM

## 2020-04-22 DIAGNOSIS — Z3480 Encounter for supervision of other normal pregnancy, unspecified trimester: Secondary | ICD-10-CM

## 2020-04-22 NOTE — Progress Notes (Signed)
[place] 24+3 wks,breech,cx  3.9 cm,posterior placenta gr 0,normal ovaries,svp of fluid 5 cm,fhr 150 bpm,efw 763 g 68%,anatomy complete,no obvious abnormalities

## 2020-05-13 ENCOUNTER — Ambulatory Visit (INDEPENDENT_AMBULATORY_CARE_PROVIDER_SITE_OTHER): Payer: 59 | Admitting: Women's Health

## 2020-05-13 ENCOUNTER — Encounter: Payer: Self-pay | Admitting: Women's Health

## 2020-05-13 ENCOUNTER — Other Ambulatory Visit: Payer: 59

## 2020-05-13 VITALS — BP 110/70 | HR 89 | Wt 164.4 lb

## 2020-05-13 DIAGNOSIS — Z3480 Encounter for supervision of other normal pregnancy, unspecified trimester: Secondary | ICD-10-CM

## 2020-05-13 DIAGNOSIS — Z3482 Encounter for supervision of other normal pregnancy, second trimester: Secondary | ICD-10-CM

## 2020-05-13 DIAGNOSIS — Z23 Encounter for immunization: Secondary | ICD-10-CM

## 2020-05-13 DIAGNOSIS — Z1389 Encounter for screening for other disorder: Secondary | ICD-10-CM

## 2020-05-13 DIAGNOSIS — Z331 Pregnant state, incidental: Secondary | ICD-10-CM

## 2020-05-13 DIAGNOSIS — Z3A27 27 weeks gestation of pregnancy: Secondary | ICD-10-CM

## 2020-05-13 DIAGNOSIS — Z131 Encounter for screening for diabetes mellitus: Secondary | ICD-10-CM

## 2020-05-13 LAB — POCT URINALYSIS DIPSTICK OB
Blood, UA: NEGATIVE
Glucose, UA: NEGATIVE
Ketones, UA: NEGATIVE
Nitrite, UA: NEGATIVE
POC,PROTEIN,UA: NEGATIVE

## 2020-05-13 NOTE — Patient Instructions (Signed)
Stacy Oneill, I greatly value your feedback.  If you receive a survey following your visit with [place] today, we appreciate you taking the time to fill it out.  Thanks, [name], CNM, WHNP-BC   [facility] ([address] [place], [place] [address]) Entrance C, located off of [address] valet parking  Go to [email or web address] to register for FREE online childbirth classes   Call the office [phone]) or go to [facility] if:  You begin to have strong, frequent contractions  Your water breaks.  Sometimes it is a big gush of fluid, sometimes it is just a trickle that keeps getting your panties wet or running down your legs  You have vaginal bleeding.  It is normal to have a small amount of spotting if your cervix was checked.   You don't feel your baby moving like normal.  If you don't, get you something to eat and drink and lay down and focus on feeling your baby move.  You should feel at least 10 movements in 2 hours.  If you don't, you should call the office or go to [facility].    Tdap Vaccine  It is recommended that you get the Tdap vaccine during the third trimester of EACH pregnancy to help protect your baby from getting pertussis (whooping cough)  27-36 weeks is the BEST time to do this so that you can pass the protection on to your baby. During pregnancy is better than after pregnancy, but if you are unable to get it during pregnancy it will be offered at the hospital.   You can get this vaccine with [place], at the health department, your family doctor, or some local pharmacies  Everyone who will be around your baby should also be up-to-date on their vaccines before the baby comes. Adults (who are not pregnant) only need 1 dose of Tdap during adulthood.   [place] Pediatricians/Family Doctors:  [facility] [phone]            [facility] [phone]                 [facility]  [phone] (usually not accepting new patients unless you have family there already, you are always welcome to call and ask)       [facility] Department [phone]       [facility] Pediatricians/Family Doctors:   [facility]: [phone]  [facility]: [phone]  [facility]: [phone]  [facility] Doctors:   [facility]: [phone]   [facility]: [phone]  [facility] Doctors:  [facility]: [phone]   Home Blood Pressure Monitoring for Patients   Your provider has recommended that you check your blood pressure (BP) at least once a week at home. If you do not have a blood pressure cuff at home, one will be provided for you. Contact your provider if you have not received your monitor within 1 week.   Helpful Tips for Accurate Home Blood Pressure Checks  . Don't smoke, exercise, or drink caffeine 30 minutes before checking your BP . Use the restroom before checking your BP (a full bladder can raise your pressure) . Relax in a comfortable upright chair . Feet on the ground . Left arm resting comfortably on a flat surface at the level of your heart . Legs uncrossed . Back supported . Sit quietly and don't talk . Place the cuff on your  bare arm . Adjust snuggly, so that only two fingertips can fit between your skin and the top of the cuff . Check 2 readings separated by at least one minute . Keep a log of your BP readings . For a visual, please reference this diagram: http://ccnc.care/bpdiagram  Provider Name: [facility]     Phone: [phone]  Zone 1: ALL CLEAR  Continue to monitor your symptoms:  . BP reading is less than 140 (top number) or less than 90 (bottom number)  . No right upper stomach pain . No headaches or seeing spots . No feeling nauseated or throwing up . No swelling in face and hands  Zone 2: CAUTION Call your  doctor's office for any of the following:  . BP reading is greater than 140 (top number) or greater than 90 (bottom number)  . Stomach pain under your ribs in the middle or right side . Headaches or seeing spots . Feeling nauseated or throwing up . Swelling in face and hands  Zone 3: EMERGENCY  Seek immediate medical care if you have any of the following:  . BP reading is greater than160 (top number) or greater than 110 (bottom number) . Severe headaches not improving with Tylenol . Serious difficulty catching your breath . Any worsening symptoms from Zone 2   Third Trimester of Pregnancy The third trimester is from week 29 through week 42, months 7 through 9. The third trimester is a time when the fetus is growing rapidly. At the end of the ninth month, the fetus is about 20 inches in length and weighs 6-10 pounds.  BODY CHANGES Your body goes through many changes during pregnancy. The changes vary from woman to woman.   Your weight will continue to increase. You can expect to gain 25-35 pounds (11-16 kg) by the end of the pregnancy.  You may begin to get stretch marks on your hips, abdomen, and breasts.  You may urinate more often because the fetus is moving lower into your pelvis and pressing on your bladder.  You may develop or continue to have heartburn as a result of your pregnancy.  You may develop constipation because certain hormones are causing the muscles that push waste through your intestines to slow down.  You may develop hemorrhoids or swollen, bulging veins (varicose veins).  You may have pelvic pain because of the weight gain and pregnancy hormones relaxing your joints between the bones in your pelvis. Backaches may result from overexertion of the muscles supporting your posture.  You may have changes in your hair. These can include thickening of your hair, rapid growth, and changes in texture. Some women also have hair loss during or after pregnancy, or hair that  feels dry or thin. Your hair will most likely return to normal after your baby is born.  Your breasts will continue to grow and be tender. A yellow discharge may leak from your breasts called colostrum.  Your belly button may stick out.  You may feel short of breath because of your expanding uterus.  You may notice the fetus "dropping," or moving lower in your abdomen.  You may have a bloody mucus discharge. This usually occurs a few days to a week before labor begins.  Your cervix becomes thin and soft (effaced) near your due date. WHAT TO EXPECT AT YOUR PRENATAL EXAMS  You will have prenatal exams every 2 weeks until week 36. Then, you will have weekly prenatal exams. During a routine prenatal visit:  You will be weighed to make sure you and the fetus are growing normally.  Your blood pressure is taken.  Your abdomen will be measured to track your baby's growth.  The fetal heartbeat will be listened to.  Any test results from the previous visit will be discussed.  You may have a cervical check near your due date to see if you have effaced. At around 36 weeks, your caregiver will check your cervix. At the same time, your caregiver will also perform a test on the secretions of the vaginal tissue. This test is to determine if a type of bacteria, Group B streptococcus, is present. Your caregiver will explain this further. Your caregiver may ask you:  What your birth plan is.  How you are feeling.  If you are feeling the baby move.  If you have had any abnormal symptoms, such as leaking fluid, bleeding, severe headaches, or abdominal cramping.  If you have any questions. Other tests or screenings that may be performed during your third trimester include:  Blood tests that check for low iron levels (anemia).  Fetal testing to check the health, activity level, and growth of the fetus. Testing is done if you have certain medical conditions or if there are problems during the  pregnancy. FALSE LABOR You may feel small, irregular contractions that eventually go away. These are called Braxton Hicks contractions, or false labor. Contractions may last for hours, days, or even weeks before true labor sets in. If contractions come at regular intervals, intensify, or become painful, it is best to be seen by your caregiver.  SIGNS OF LABOR   Menstrual-like cramps.  Contractions that are 5 minutes apart or less.  Contractions that start on the top of the uterus and spread down to the lower abdomen and back.  A sense of increased pelvic pressure or back pain.  A watery or bloody mucus discharge that comes from the vagina. If you have any of these signs before the 37th week of pregnancy, call your caregiver right away. You need to go to the hospital to get checked immediately. HOME CARE INSTRUCTIONS   Avoid all smoking, herbs, alcohol, and unprescribed drugs. These chemicals affect the formation and growth of the baby.  Follow your caregiver's instructions regarding medicine use. There are medicines that are either safe or unsafe to take during pregnancy.  Exercise only as directed by your caregiver. Experiencing uterine cramps is a good sign to stop exercising.  Continue to eat regular, healthy meals.  Wear a good support bra for breast tenderness.  Do not use hot tubs, steam rooms, or saunas.  Wear your seat belt at all times when driving.  Avoid raw meat, uncooked cheese, cat litter boxes, and soil used by cats. These carry germs that can cause birth defects in the baby.  Take your prenatal vitamins.  Try taking a stool softener (if your caregiver approves) if you develop constipation. Eat more high-fiber foods, such as fresh vegetables or fruit and whole grains. Drink plenty of fluids to keep your urine clear or pale yellow.  Take warm sitz baths to soothe any pain or discomfort caused by hemorrhoids. Use hemorrhoid cream if your caregiver approves.  If you  develop varicose veins, wear support hose. Elevate your feet for 15 minutes, 3-4 times a day. Limit salt in your diet.  Avoid heavy lifting, wear low heal shoes, and practice good posture.  Rest a lot with your legs elevated if you have leg cramps or low  back pain.  Visit your dentist if you have not gone during your pregnancy. Use a soft toothbrush to brush your teeth and be gentle when you floss.  A sexual relationship may be continued unless your caregiver directs you otherwise.  Do not travel far distances unless it is absolutely necessary and only with the approval of your caregiver.  Take prenatal classes to understand, practice, and ask questions about the labor and delivery.  Make a trial run to the hospital.  Pack your hospital bag.  Prepare the baby's nursery.  Continue to go to all your prenatal visits as directed by your caregiver. SEEK MEDICAL CARE IF:  You are unsure if you are in labor or if your water has broken.  You have dizziness.  You have mild pelvic cramps, pelvic pressure, or nagging pain in your abdominal area.  You have persistent nausea, vomiting, or diarrhea.  You have a bad smelling vaginal discharge.  You have pain with urination. SEEK IMMEDIATE MEDICAL CARE IF:   You have a fever.  You are leaking fluid from your vagina.  You have spotting or bleeding from your vagina.  You have severe abdominal cramping or pain.  You have rapid weight loss or gain.  You have shortness of breath with chest pain.  You notice sudden or extreme swelling of your face, hands, ankles, feet, or legs.  You have not felt your baby move in over an hour.  You have severe headaches that do not go away with medicine.  You have vision changes. Document Released: 10/17/2001 Document Revised: 10/28/2013 Document Reviewed: 12/24/2012 [facility] Patient Information 2015 [place], [place]. This information is not intended to replace advice given to you by your health  care provider. Make sure you discuss any questions you have with your health care provider.

## 2020-05-13 NOTE — Addendum Note (Signed)
Addended by: Federico Flake A on: 05/13/2020 10:01 AM   Modules accepted: Orders

## 2020-05-13 NOTE — Progress Notes (Signed)
LOW-RISK PREGNANCY VISIT Patient name: Stacy Oneill MRN [id number]  Date of birth: Jul 08, 1991 Chief Complaint:   Routine Prenatal Visit (PN2/ ? tdap)  History of Present Illness:   Stacy Oneill is a 29 y.o. [phone] female at [redacted]w[redacted]d with an Estimated Date of Delivery: 08/09/20 being seen today for ongoing management of a low-risk pregnancy.  Depression screen [facility] 2/9 05/13/2020 01/29/2020 12/18/2019  Decreased Interest 0 0 0  Down, Depressed, Hopeless 0 0 0  PHQ - 2 Score 0 0 0  Altered sleeping 0 0 -  Tired, decreased energy 2 2 -  Change in appetite 0 1 -  Feeling bad or failure about yourself  0 0 -  Trouble concentrating 0 0 -  Moving slowly or fidgety/restless 0 0 -  Suicidal thoughts 0 0 -  PHQ-9 Score 2 3 -  Difficult doing work/chores Not difficult at all Not difficult at all -    Today she reports no complaints. Contractions: Not present.  .  Movement: Present. denies leaking of fluid. Review of Systems:   Pertinent items are noted in HPI Denies abnormal vaginal discharge w/ itching/odor/irritation, headaches, visual changes, shortness of breath, chest pain, abdominal pain, severe nausea/vomiting, or problems with urination or bowel movements unless otherwise stated above. Pertinent History Reviewed:  Reviewed past medical,surgical, social, obstetrical and family history.  Reviewed problem list, medications and allergies. Physical Assessment:   Vitals:   05/13/20 0930  BP: 110/70  Pulse: 89  Weight: 164 lb 6.4 oz (74.6 kg)  Body mass index is 30.07 kg/m.        Physical Examination:   General appearance: Well appearing, and in no distress  Mental status: Alert, oriented to person, place, and time  Skin: Warm & dry  Cardiovascular: Normal heart rate noted  Respiratory: Normal respiratory effort, no distress  Abdomen: Soft, gravid, nontender  Pelvic: Cervical exam deferred         Extremities: Edema: None  Fetal Status: Fetal Heart Rate (bpm): 147 Fundal  Height: 28 cm Movement: Present    Chaperone: n/a    Results for orders placed or performed in visit on 05/13/20 (from the past 24 hour(s))  POC Urinalysis Dipstick OB   Collection Time: 05/13/20  9:38 AM  Result Value Ref Range   Color, UA     Clarity, UA     Glucose, UA Negative Negative   Bilirubin, UA     Ketones, UA neg    Spec Grav, UA     Blood, UA neg    pH, UA     POC,PROTEIN,UA Negative Negative, Trace, Small (1+), Moderate (2+), Large (3+), 4+   Urobilinogen, UA     Nitrite, UA neg    Leukocytes, UA Moderate (2+) (A) Negative   Appearance     Odor      Assessment & Plan:  1) Low-risk pregnancy [id number] at [redacted]w[redacted]d with an Estimated Date of Delivery: 08/09/20     Meds: No orders of the defined types were placed in this encounter.  Labs/procedures today: pn2, tdap  Plan:  Continue routine obstetrical care  Next visit: prefers will be in person for rhogam, then wants to start online visits    Reviewed: Preterm labor symptoms and general obstetric precautions including but not limited to vaginal bleeding, contractions, leaking of fluid and fetal movement were reviewed in detail with the patient.  All questions were answered. Does not have home bp cuff, but is planning to purchase one. Check bp  weekly, let us know if >140/90.   Follow-up: Return in about 3 weeks (around 06/03/2020) for LROB, CNM, in person w/ Rhogam; resend release for pap from 6/8 please.  Orders Placed This Encounter  Procedures   POC Urinalysis Dipstick OB   Cheral Marker CNM, [facility] 05/13/2020 9:54 AM

## 2020-05-14 ENCOUNTER — Other Ambulatory Visit: Payer: Self-pay | Admitting: Women's Health

## 2020-05-14 LAB — CBC
Hematocrit: 30.9 % — ABNORMAL LOW (ref 34.0–46.6)
Hemoglobin: 10 g/dL — ABNORMAL LOW (ref 11.1–15.9)
MCH: 27.1 pg (ref 26.6–33.0)
MCHC: 32.4 g/dL (ref 31.5–35.7)
MCV: 84 fL (ref 79–97)
Platelets: 166 10*3/uL (ref 150–450)
RBC: 3.69 x10E6/uL — ABNORMAL LOW (ref 3.77–5.28)
RDW: 12.5 % (ref 11.7–15.4)
WBC: 10.5 10*3/uL (ref 3.4–10.8)

## 2020-05-14 LAB — HIV ANTIBODY (ROUTINE TESTING W REFLEX): HIV Screen 4th Generation wRfx: NONREACTIVE

## 2020-05-14 LAB — GLUCOSE TOLERANCE, 2 HOURS W/ 1HR
Glucose, 1 hour: 92 mg/dL (ref 65–179)
Glucose, 2 hour: 98 mg/dL (ref 65–152)
Glucose, Fasting: 73 mg/dL (ref 65–91)

## 2020-05-14 LAB — ANTIBODY SCREEN: Antibody Screen: NEGATIVE

## 2020-05-14 LAB — RPR: RPR Ser Ql: NONREACTIVE

## 2020-05-14 MED ORDER — FERROUS SULFATE 325 (65 FE) MG PO TABS: 325.0000 mg | ORAL_TABLET | Freq: Two times a day (BID) | ORAL | 3 refills | Status: DC

## 2020-05-24 ENCOUNTER — Other Ambulatory Visit: Payer: Self-pay | Admitting: Women's Health

## 2020-05-24 DIAGNOSIS — L299 Pruritus, unspecified: Secondary | ICD-10-CM

## 2020-05-24 DIAGNOSIS — Z3483 Encounter for supervision of other normal pregnancy, third trimester: Secondary | ICD-10-CM

## 2020-05-27 LAB — COMPREHENSIVE METABOLIC PANEL
ALT: 9 IU/L (ref 0–32)
AST: 17 IU/L (ref 0–40)
Albumin/Globulin Ratio: 1.4 (ref 1.2–2.2)
Albumin: 3.8 g/dL — ABNORMAL LOW (ref 3.9–5.0)
Alkaline Phosphatase: 107 IU/L (ref 48–121)
BUN/Creatinine Ratio: 13 (ref 9–23)
BUN: 6 mg/dL (ref 6–20)
Bilirubin Total: 0.2 mg/dL (ref 0.0–1.2)
CO2: 23 mmol/L (ref 20–29)
Calcium: 8.8 mg/dL (ref 8.7–10.2)
Chloride: 101 mmol/L (ref 96–106)
Creatinine, Ser: 0.45 mg/dL — ABNORMAL LOW (ref 0.57–1.00)
GFR calc Af Amer: 157 mL/min/{1.73_m2} (ref >59)
GFR calc non Af Amer: 136 mL/min/{1.73_m2} (ref >59)
Globulin, Total: 2.8 g/dL (ref 1.5–4.5)
Glucose: 75 mg/dL (ref 65–99)
Potassium: 4.1 mmol/L (ref 3.5–5.2)
Sodium: 136 mmol/L (ref 134–144)
Total Protein: 6.6 g/dL (ref 6.0–8.5)

## 2020-05-27 LAB — BILE ACIDS, TOTAL: Bile Acids Total: 2.4 umol/L (ref 0.0–10.0)

## 2020-06-03 ENCOUNTER — Encounter: Payer: BC Managed Care – PPO | Admitting: Obstetrics & Gynecology

## 2020-06-08 ENCOUNTER — Ambulatory Visit (INDEPENDENT_AMBULATORY_CARE_PROVIDER_SITE_OTHER): Payer: BC Managed Care – PPO | Admitting: Obstetrics & Gynecology

## 2020-06-08 ENCOUNTER — Encounter: Payer: Self-pay | Admitting: Obstetrics & Gynecology

## 2020-06-08 VITALS — BP 109/52 | HR 67 | Wt 169.5 lb

## 2020-06-08 DIAGNOSIS — O26893 Other specified pregnancy related conditions, third trimester: Secondary | ICD-10-CM | POA: Diagnosis not present

## 2020-06-08 DIAGNOSIS — Z6791 Unspecified blood type, Rh negative: Secondary | ICD-10-CM

## 2020-06-08 DIAGNOSIS — Z331 Pregnant state, incidental: Secondary | ICD-10-CM

## 2020-06-08 DIAGNOSIS — Z3A31 31 weeks gestation of pregnancy: Secondary | ICD-10-CM

## 2020-06-08 DIAGNOSIS — Z1389 Encounter for screening for other disorder: Secondary | ICD-10-CM

## 2020-06-08 DIAGNOSIS — Z3483 Encounter for supervision of other normal pregnancy, third trimester: Secondary | ICD-10-CM

## 2020-06-08 NOTE — Progress Notes (Signed)
   LOW-RISK PREGNANCY VISIT Patient name: Stacy Oneill MRN [id number]  Date of birth: 1990-11-08 Chief Complaint:   Routine Prenatal Visit (Rhogam today)  History of Present Illness:   Stacy Oneill is a 29 y.o. [phone] female at [redacted]w[redacted]d with an Estimated Date of Delivery: 08/09/20 being seen today for ongoing management of a low-risk pregnancy.  Depression screen [facility] 2/9 05/13/2020 01/29/2020 12/18/2019  Decreased Interest 0 0 0  Down, Depressed, Hopeless 0 0 0  PHQ - 2 Score 0 0 0  Altered sleeping 0 0 -  Tired, decreased energy 2 2 -  Change in appetite 0 1 -  Feeling bad or failure about yourself  0 0 -  Trouble concentrating 0 0 -  Moving slowly or fidgety/restless 0 0 -  Suicidal thoughts 0 0 -  PHQ-9 Score 2 3 -  Difficult doing work/chores Not difficult at all Not difficult at all -    Today she reports no complaints. Contractions: Not present. Vag. Bleeding: None.  Movement: Present. denies leaking of fluid. Review of Systems:   Pertinent items are noted in HPI Denies abnormal vaginal discharge w/ itching/odor/irritation, headaches, visual changes, shortness of breath, chest pain, abdominal pain, severe nausea/vomiting, or problems with urination or bowel movements unless otherwise stated above. Pertinent History Reviewed:  Reviewed past medical,surgical, social, obstetrical and family history.  Reviewed problem list, medications and allergies. Physical Assessment:   Vitals:   06/08/20 0907  BP: (!) 109/52  Pulse: 67  Weight: 169 lb 8 oz (76.9 kg)  Body mass index is 31 kg/m.        Physical Examination:   General appearance: Well appearing, and in no distress  Mental status: Alert, oriented to person, place, and time  Skin: Warm & dry  Cardiovascular: Normal heart rate noted  Respiratory: Normal respiratory effort, no distress  Abdomen: Soft, gravid, nontender  Pelvic: Cervical exam deferred         Extremities: Edema: None  Fetal Status: Fetal Heart Rate  (bpm): 134 Fundal Height: 32 cm Movement: Present    Chaperone: Amanda Rash    No results found for this or any previous visit (from the past 24 hour(s)).  Assessment & Plan:  1) Low-risk pregnancy [id number] at [redacted]w[redacted]d with an Estimated Date of Delivery: 08/09/20   2) wants BTL,    Meds: No orders of the defined types were placed in this encounter.  Labs/procedures today:   Plan:  Continue routine obstetrical care  Next visit: prefers in person    Reviewed: Preterm labor symptoms and general obstetric precautions including but not limited to vaginal bleeding, contractions, leaking of fluid and fetal movement were reviewed in detail with the patient.  All questions were answered. no home bp cuff. Rx faxed to . Check bp weekly, let us know if >140/90.   Follow-up: Return in about 3 weeks (around 06/29/2020) for LROB.  Orders Placed This Encounter  Procedures  . RHO (D) Immune Globulin  . POC Urinalysis Dipstick OB   Lazaro Arms  06/08/2020 9:35 AM

## 2020-06-08 NOTE — Addendum Note (Signed)
Addended by: Colen Darling on: 06/08/2020 10:59 AM   Modules accepted: Orders

## 2020-06-17 ENCOUNTER — Ambulatory Visit (INDEPENDENT_AMBULATORY_CARE_PROVIDER_SITE_OTHER): Payer: BC Managed Care – PPO | Admitting: Obstetrics and Gynecology

## 2020-06-17 ENCOUNTER — Encounter: Payer: Self-pay | Admitting: Obstetrics and Gynecology

## 2020-06-17 VITALS — BP 105/56 | HR 70

## 2020-06-17 DIAGNOSIS — Z331 Pregnant state, incidental: Secondary | ICD-10-CM

## 2020-06-17 DIAGNOSIS — Z1389 Encounter for screening for other disorder: Secondary | ICD-10-CM

## 2020-06-17 DIAGNOSIS — B373 Candidiasis of vulva and vagina: Secondary | ICD-10-CM

## 2020-06-17 DIAGNOSIS — Z3480 Encounter for supervision of other normal pregnancy, unspecified trimester: Secondary | ICD-10-CM | POA: Diagnosis not present

## 2020-06-17 DIAGNOSIS — B3731 Acute candidiasis of vulva and vagina: Secondary | ICD-10-CM

## 2020-06-17 LAB — POCT URINALYSIS DIPSTICK OB
Blood, UA: NEGATIVE
Glucose, UA: NEGATIVE
Ketones, UA: NEGATIVE
Leukocytes, UA: NEGATIVE
Nitrite, UA: NEGATIVE
POC,PROTEIN,UA: NEGATIVE

## 2020-06-17 MED ORDER — FLUCONAZOLE 150 MG PO TABS: 150.0000 mg | ORAL_TABLET | ORAL | 1 refills | Status: DC

## 2020-06-17 MED ORDER — TERCONAZOLE 0.4 % VA CREA
1.0000 | TOPICAL_CREAM | Freq: Every day | VAGINAL | 0 refills | Status: DC
Start: 2020-06-17 — End: 2020-06-22

## 2020-06-17 NOTE — Progress Notes (Signed)
Patient ID: Stacy Oneill, female   DOB: [birth year], 29 y.o.   MRN: [id number]   LOW-RISK PREGNANCY VISIT Patient name: Stacy Oneill MRN [id number]  Date of birth: 1991-07-21 Chief Complaint:   w/i heavy discharge (x1 week)  History of Present Illness:   Stacy Oneill is a 29 y.o. [phone] female at [redacted]w[redacted]d with an Estimated Date of Delivery: 08/09/20 being seen today for ongoing management of a low-risk pregnancy.  The patient was worked in today for a clear, watery vaginal discharge. She states that it is continuous and that she feels a "gush" when she sits down. She has had discharge before, but nothing that has felt like this. This is her fourth pregnancy and her last baby was her biggest. Other than the discharge, this pregnancy has gone very well. Denies vaginal itching. + vulvar sensitivity, no itching  Contractions: Not present. Vag. Bleeding: None.  Movement: Present. reports leaking of fluid. Review of Systems:   Pertinent items are noted in HPI Denies abnormal vaginal discharge w/ itching/odor/irritation, headaches, visual changes, shortness of breath, chest pain, abdominal pain, severe nausea/vomiting, or problems with urination or bowel movements unless otherwise stated above. Pertinent History Reviewed:  Reviewed past medical,surgical, social, obstetrical and family history.  Reviewed problem list, medications and allergies. Physical Assessment:   Vitals:   06/17/20 0846  BP: (!) 105/56  Pulse: 70  There is no height or weight on file to calculate BMI.        Physical Examination:   General appearance: Well appearing, and in no distress  Mental status: Alert, oriented to person, place, and time  Skin: Warm & dry  Cardiovascular: Normal heart rate noted  Respiratory: Normal respiratory effort, no distress  Abdomen: Soft, gravid, nontender  Pelvic:          Vagina: Thick, yellow discharge. Watery discharge, pH 4.5.  Wet Prep: Yeast infection, fern negative for  membrane rupture  Extremities: Edema: None  Fetal Status:     Movement: Present    Results for orders placed or performed in visit on 06/17/20 (from the past 24 hour(s))  POC Urinalysis Dipstick OB   Collection Time: 06/17/20  8:47 AM  Result Value Ref Range   Color, UA     Clarity, UA     Glucose, UA Negative Negative   Bilirubin, UA     Ketones, UA neg    Spec Grav, UA     Blood, UA neg    pH, UA     POC,PROTEIN,UA Negative Negative, Trace, Small (1+), Moderate (2+), Large (3+), 4+   Urobilinogen, UA     Nitrite, UA neg    Leukocytes, UA Negative Negative   Appearance     Odor      Assessment & Plan:  1) Low-risk pregnancy G4P3003 at [redacted]w[redacted]d with an Estimated Date of Delivery: 08/09/20   2) Yeast Infection, Rx Diflucan and Terazol  3) Fern negative for membrane rupture  4) Desires BTL   Plan:  Continue routine obstetrical care  Meds:  Meds ordered this encounter  Medications  . fluconazole (DIFLUCAN) 150 MG tablet    Sig: Take 1 tablet (150 mg total) by mouth every 3 (three) days.    Dispense:  2 tablet    Refill:  1  . terconazole (TERAZOL 7) 0.4 % vaginal cream    Sig: Place 1 applicator vaginally at bedtime.    Dispense:  45 g    Refill:  0   Labs/procedures today: none  Reviewed: Preterm labor symptoms and general obstetric precautions including but not limited to vaginal bleeding, contractions, leaking of fluid and fetal movement were reviewed in detail with the patient.  All questions were answered  Follow-up: Return for as scheduled, LROB.  By signing my name below, I, Pietro Cassis, attest that this documentation has been prepared under the direction and in the presence of Tilda Burrow, MD. Electronically Signed: Pietro Cassis, Medical Scribe. 06/17/20. 9:09 AM.  I personally performed the services described in this documentation, which was SCRIBED in my presence. The recorded information has been reviewed and considered accurate. It has been edited  as necessary during review. Tilda Burrow, MD

## 2020-06-17 NOTE — Patient Instructions (Signed)
Vaginal Yeast Infection, Adult  Vaginal yeast infection is a condition that causes vaginal discharge as well as soreness, swelling, and redness (inflammation) of the vagina. This is a common condition. Some women get this infection frequently. What are the causes? This condition is caused by a change in the normal balance of the yeast (candida) and bacteria that live in the vagina. This change causes an overgrowth of yeast, which causes the inflammation. What increases the risk? The condition is more likely to develop in women who:  Take antibiotic medicines.  Have diabetes.  Take birth control pills.  Are pregnant.  Douche often.  Have a weak body defense system (immune system).  Have been taking steroid medicines for a long time.  Frequently wear tight clothing. What are the signs or symptoms? Symptoms of this condition include:  White, thick, creamy vaginal discharge.  Swelling, itching, redness, and irritation of the vagina. The lips of the vagina (vulva) may be affected as well.  Pain or a burning feeling while urinating.  Pain during sex. How is this diagnosed? This condition is diagnosed based on:  Your medical history.  A physical exam.  A pelvic exam. Your health care provider will examine a sample of your vaginal discharge under a microscope. Your health care provider may send this sample for testing to confirm the diagnosis. How is this treated? This condition is treated with medicine. Medicines may be over-the-counter or prescription. You may be told to use one or more of the following:  Medicine that is taken by mouth (orally).  Medicine that is applied as a cream (topically).  Medicine that is inserted directly into the vagina (suppository). Follow these instructions at home:  Lifestyle  Do not have sex until your health care provider approves. Tell your sex partner that you have a yeast infection. That person should go to his or her health care  provider and ask if they should also be treated.  Do not wear tight clothes, such as pantyhose or tight pants.  Wear breathable cotton underwear. General instructions  Take or apply over-the-counter and prescription medicines only as told by your health care provider.  Eat more yogurt. This may help to keep your yeast infection from returning.  Do not use tampons until your health care provider approves.  Try taking a sitz bath to help with discomfort. This is a warm water bath that is taken while you are sitting down. The water should only come up to your hips and should cover your buttocks. Do this 3-4 times per day or as told by your health care provider.  Do not douche.  If you have diabetes, keep your blood sugar levels under control.  Keep all follow-up visits as told by your health care provider. This is important. Contact a health care provider if:  You have a fever.  Your symptoms go away and then return.  Your symptoms do not get better with treatment.  Your symptoms get worse.  You have new symptoms.  You develop blisters in or around your vagina.  You have blood coming from your vagina and it is not your menstrual period.  You develop pain in your abdomen. Summary  Vaginal yeast infection is a condition that causes discharge as well as soreness, swelling, and redness (inflammation) of the vagina.  This condition is treated with medicine. Medicines may be over-the-counter or prescription.  Take or apply over-the-counter and prescription medicines only as told by your health care provider.  Do not douche.   Do not have sex or use tampons until your health care provider approves.  Contact a health care provider if your symptoms do not get better with treatment or your symptoms go away and then return. This information is not intended to replace advice given to you by your health care provider. Make sure you discuss any questions you have with your health care  provider. Document Revised: 05/23/2019 Document Reviewed: 03/11/2018 Elsevier Patient Education  2020 Elsevier Inc.  

## 2020-06-22 ENCOUNTER — Other Ambulatory Visit (HOSPITAL_COMMUNITY)
Admission: RE | Admit: 2020-06-22 | Discharge: 2020-06-22 | Disposition: A | Discharge status: Home / Self Care | Payer: BC Managed Care – PPO | Source: Ambulatory Visit | Attending: Obstetrics and Gynecology | Admitting: Obstetrics and Gynecology

## 2020-06-22 ENCOUNTER — Ambulatory Visit (INDEPENDENT_AMBULATORY_CARE_PROVIDER_SITE_OTHER): Payer: BC Managed Care – PPO | Admitting: Obstetrics and Gynecology

## 2020-06-22 ENCOUNTER — Encounter: Payer: Self-pay | Admitting: Obstetrics and Gynecology

## 2020-06-22 ENCOUNTER — Other Ambulatory Visit: Payer: Self-pay

## 2020-06-22 VITALS — BP 119/78 | HR 83 | Wt 170.6 lb

## 2020-06-22 DIAGNOSIS — Z3483 Encounter for supervision of other normal pregnancy, third trimester: Secondary | ICD-10-CM | POA: Diagnosis not present

## 2020-06-22 DIAGNOSIS — Z331 Pregnant state, incidental: Secondary | ICD-10-CM | POA: Diagnosis not present

## 2020-06-22 DIAGNOSIS — Z1389 Encounter for screening for other disorder: Secondary | ICD-10-CM | POA: Diagnosis not present

## 2020-06-22 DIAGNOSIS — Z3A33 33 weeks gestation of pregnancy: Secondary | ICD-10-CM

## 2020-06-22 DIAGNOSIS — Z3480 Encounter for supervision of other normal pregnancy, unspecified trimester: Secondary | ICD-10-CM

## 2020-06-22 LAB — POCT URINALYSIS DIPSTICK OB
Blood, UA: NEGATIVE
Glucose, UA: NEGATIVE
Ketones, UA: NEGATIVE
Nitrite, UA: NEGATIVE
POC,PROTEIN,UA: NEGATIVE

## 2020-06-22 NOTE — Progress Notes (Signed)
PATIENT ID: [name], female     DOB: 1991/03/31, 29 y.o.     MRN: [id number]    LOW-RISK PREGNANCY VISIT PATIENT NAME: Stacy Oneill MRN [id number]  DOB: 1990/11/09  Chief Complaint:   Routine Prenatal Visit (discuss being taken out of work)   History of Present Illness:   Stacy Oneill is a 29 y.o. [phone] female at [redacted]w[redacted]d with an Estimated Date of Delivery: 08/09/20 being seen today for ongoing management of a low-risk pregnancy.    Depression screen [facility] 2/9 05/13/2020 01/29/2020 12/18/2019  Decreased Interest 0 0 0  Down, Depressed, Hopeless 0 0 0  PHQ - 2 Score 0 0 0  Altered sleeping 0 0 -  Tired, decreased energy 2 2 -  Change in appetite 0 1 -  Feeling bad or failure about yourself  0 0 -  Trouble concentrating 0 0 -  Moving slowly or fidgety/restless 0 0 -  Suicidal thoughts 0 0 -  PHQ-9 Score 2 3 -  Difficult doing work/chores Not difficult at all Not difficult at all -    Today she reports no complaints. Contractions: Not present. Vag. Bleeding: None.  Movement: Present. denies leaking of fluid.  Review of Systems:   Pertinent items are noted in HPI Denies abnormal vaginal discharge w/ itching/odor/irritation, headaches, visual changes, shortness of breath, chest pain, abdominal pain, severe nausea/vomiting, or problems with urination or bowel movements unless otherwise stated above.  Pertinent History Reviewed:  Reviewed past medical,surgical, social, obstetrical and family history.  Reviewed problem list, medications and allergies.  Physical Assessment:   Vitals:   06/22/20 1040  BP: 119/78  Pulse: 83  Weight: 170 lb 9.6 oz (77.4 kg)  Body mass index is 31.2 kg/m.        Physical Examination:   General appearance: Well appearing, and in no distress  Mental status: Alert, oriented to person, place, and time  Skin: Warm & dry  Cardiovascular: Normal heart rate noted  Respiratory: Normal respiratory effort, no distress  Abdomen: Soft, gravid,  nontender   Baby: 145   34-35 cm  Pelvic: pap done. cervix visually closed         Extremities: Edema: Trace  Fetal Status:     Movement: Present    Chaperone: Jobe Marker    Results for orders placed or performed in visit on 06/22/20 (from the past 24 hour(s))  POC Urinalysis Dipstick OB   Collection Time: 06/22/20 10:36 AM  Result Value Ref Range   Color, UA     Clarity, UA     Glucose, UA Negative Negative   Bilirubin, UA     Ketones, UA neg    Spec Grav, UA     Blood, UA neg    pH, UA     POC,PROTEIN,UA Negative Negative, Trace, Small (1+), Moderate (2+), Large (3+), 4+   Urobilinogen, UA     Nitrite, UA neg    Leukocytes, UA Small (1+) (A) Negative   Appearance     Odor      Assessment & Plan:  1) Low-risk pregnancy [id number] at [redacted]w[redacted]d with an Estimated Date of Delivery: 08/09/20   2) desires PP BTL , in hospital Works 2 full jobs. 2nd job is cooperative, 1st job is asking pt to go out on leave ([employer]) , works from home. Best option is to begin short term disability at this time. Pt is at risk of losing her job with [employer] if she stays on. She is  encouraged by personnel office to seek ST disability.    Meds: No orders of the defined types were placed in this encounter.  Labs/procedures today: u/a  Plan:  Continue routine obstetrical care  Next visit: prefers online    Reviewed: Preterm labor symptoms and general obstetric precautions including but not limited to vaginal bleeding, contractions, leaking of fluid and fetal movement were reviewed in detail with the patient.  All questions were answered. Is aware that  home bp cuff has been Rx faxed to [facility]. Check bp weekly, let us know if >140/90.   Follow-up: No follow-ups on file.   By signing my name below, I, [employer], attest that this documentation has been prepared under the direction and in the presence of Tilda Burrow, MD. Electronically Signed: Mal Misty Medical Scribe. 06/22/20.  10:51 AM.  I personally performed the services described in this documentation, which was SCRIBED in my presence. The recorded information has been reviewed and considered accurate. It has been edited as necessary during review. Tilda Burrow, MD

## 2020-06-24 LAB — CYTOLOGY - PAP
Comment: NEGATIVE
Diagnosis: NEGATIVE
High risk HPV: NEGATIVE

## 2020-06-29 ENCOUNTER — Encounter: Payer: BC Managed Care – PPO | Admitting: Women's Health

## 2020-06-30 DIAGNOSIS — Z029 Encounter for administrative examinations, unspecified: Secondary | ICD-10-CM

## 2020-07-05 ENCOUNTER — Telehealth (INDEPENDENT_AMBULATORY_CARE_PROVIDER_SITE_OTHER): Payer: BC Managed Care – PPO | Admitting: Women's Health

## 2020-07-05 ENCOUNTER — Encounter: Payer: Self-pay | Admitting: Women's Health

## 2020-07-05 VITALS — BP 110/72

## 2020-07-05 DIAGNOSIS — Z3A35 35 weeks gestation of pregnancy: Secondary | ICD-10-CM

## 2020-07-05 DIAGNOSIS — Z3483 Encounter for supervision of other normal pregnancy, third trimester: Secondary | ICD-10-CM

## 2020-07-05 DIAGNOSIS — Z3480 Encounter for supervision of other normal pregnancy, unspecified trimester: Secondary | ICD-10-CM

## 2020-07-05 NOTE — Patient Instructions (Signed)
[name], I greatly value your feedback.  If you receive a survey following your visit with [place] today, we appreciate you taking the time to fill it out.  Thanks, [name], CNM, WHNP-BC  [facility] ([address] [place], [place] [address]) Entrance C, located off of [address] valet parking   Go to [email or web address] to register for FREE online childbirth classes    Call the office [phone]) or go to [facility] if:  You begin to have strong, frequent contractions  Your water breaks.  Sometimes it is a big gush of fluid, sometimes it is just a trickle that keeps getting your panties wet or running down your legs  You have vaginal bleeding.  It is normal to have a small amount of spotting if your cervix was checked.   You don't feel your baby moving like normal.  If you don't, get you something to eat and drink and lay down and focus on feeling your baby move.  You should feel at least 10 movements in 2 hours.  If you don't, you should call the office or go to [facility].   Call the office [phone]) or go to [facility] hospital for these signs of pre-eclampsia:  Severe headache that does not go away with Tylenol  Visual changes- seeing spots, double, blurred vision  Pain under your right breast or upper abdomen that does not go away with Tums or heartburn medicine  Nausea and/or vomiting  Severe swelling in your hands, feet, and face    Home Blood Pressure Monitoring for Patients   Your provider has recommended that you check your blood pressure (BP) at least once a week at home. If you do not have a blood pressure cuff at home, one will be provided for you. Contact your provider if you have not received your monitor within 1 week.   Helpful Tips for Accurate Home Blood Pressure Checks  . Don't smoke, exercise, or drink caffeine 30 minutes before checking your BP . Use the restroom before checking your BP (a full  bladder can raise your pressure) . Relax in a comfortable upright chair . Feet on the ground . Left arm resting comfortably on a flat surface at the level of your heart . Legs uncrossed . Back supported . Sit quietly and don't talk . Place the cuff on your bare arm . Adjust snuggly, so that only two fingertips can fit between your skin and the top of the cuff . Check 2 readings separated by at least one minute . Keep a log of your BP readings . For a visual, please reference this diagram: http://ccnc.care/bpdiagram  Provider Name: [facility]     Phone: [phone]  Zone 1: ALL CLEAR  Continue to monitor your symptoms:  . BP reading is less than 140 (top number) or less than 90 (bottom number)  . No right upper stomach pain . No headaches or seeing spots . No feeling nauseated or throwing up . No swelling in face and hands  Zone 2: CAUTION Call your doctor's office for any of the following:  . BP reading is greater than 140 (top number) or greater than 90 (bottom number)  . Stomach pain under your ribs in the middle or right side . Headaches or seeing spots . Feeling nauseated or throwing up . Swelling in face and hands  Zone 3: EMERGENCY  Seek immediate medical care if you have any of  the following:  . BP reading is greater than160 (top number) or greater than 110 (bottom number) . Severe headaches not improving with Tylenol . Serious difficulty catching your breath . Any worsening symptoms from Zone 2  Preterm Labor and Birth Information  The normal length of a pregnancy is 39-41 weeks. Preterm labor is when labor starts before 37 completed weeks of pregnancy. What are the risk factors for preterm labor? Preterm labor is more likely to occur in women who:  Have certain infections during pregnancy such as a bladder infection, sexually transmitted infection, or infection inside the uterus (chorioamnionitis).  Have a shorter-than-normal cervix.  Have gone into  preterm labor before.  Have had surgery on their cervix.  Are younger than age 54 or older than age 25.  Are African American.  Are pregnant with twins or multiple babies (multiple gestation).  Take street drugs or smoke while pregnant.  Do not gain enough weight while pregnant.  Became pregnant shortly after having been pregnant. What are the symptoms of preterm labor? Symptoms of preterm labor include:  Cramps similar to those that can happen during a menstrual period. The cramps may happen with diarrhea.  Pain in the abdomen or lower back.  Regular uterine contractions that may feel like tightening of the abdomen.  A feeling of increased pressure in the pelvis.  Increased watery or bloody mucus discharge from the vagina.  Water breaking (ruptured amniotic sac). Why is it important to recognize signs of preterm labor? It is important to recognize signs of preterm labor because babies who are born prematurely may not be fully developed. This can put them at an increased risk for:  Long-term (chronic) heart and lung problems.  Difficulty immediately after birth with regulating body systems, including blood sugar, body temperature, heart rate, and breathing rate.  Bleeding in the brain.  Cerebral palsy.  Learning difficulties.  Death. These risks are highest for babies who are born before 48 weeks of pregnancy. How is preterm labor treated? Treatment depends on the length of your pregnancy, your condition, and the health of your baby. It may involve: 1. Having a stitch (suture) placed in your cervix to prevent your cervix from opening too early (cerclage). 2. Taking or being given medicines, such as: ? Hormone medicines. These may be given early in pregnancy to help support the pregnancy. ? Medicine to stop contractions. ? Medicines to help mature the baby's lungs. These may be prescribed if the risk of delivery is high. ? Medicines to prevent your baby from  developing cerebral palsy. If the labor happens before 34 weeks of pregnancy, you may need to stay in the hospital. What should I do if I think I am in preterm labor? If you think that you are going into preterm labor, call your health care provider right away. How can I prevent preterm labor in future pregnancies? To increase your chance of having a full-term pregnancy:  Do not use any tobacco products, such as cigarettes, chewing tobacco, and e-cigarettes. If you need help quitting, ask your health care provider.  Do not use street drugs or medicines that have not been prescribed to you during your pregnancy.  Talk with your health care provider before taking any herbal supplements, even if you have been taking them regularly.  Make sure you gain a healthy amount of weight during your pregnancy.  Watch for infection. If you think that you might have an infection, get it checked right away.  Make sure to  tell your health care provider if you have gone into preterm labor before. This information is not intended to replace advice given to you by your health care provider. Make sure you discuss any questions you have with your health care provider. Document Revised: 02/14/2019 Document Reviewed: 03/15/2016 Elsevier Patient Education  [place].

## 2020-07-05 NOTE — Progress Notes (Signed)
TELEHEALTH VIRTUAL OBSTETRICS VISIT ENCOUNTER NOTE Patient name: Stacy Oneill MRN [id number]  Date of birth: 21-May-1991  I connected with patient on 07/05/20 at 11:10 AM EDT by MyChart video  and verified that I am speaking with the correct person using two identifiers. Pt is not currently in our office, she is in her car riding w/ husband.  The provider is in the office.    I discussed the limitations, risks, security and privacy concerns of performing an evaluation and management service by telephone and the availability of in person appointments. I also discussed with the patient that there may be a patient responsible charge related to this service. The patient expressed understanding and agreed to proceed.  Chief Complaint:   Routine Prenatal Visit  History of Present Illness:   Stacy Oneill is a 29 y.o. [phone] female at [redacted]w[redacted]d with an Estimated Date of Delivery: 08/09/20 being evaluated today for ongoing management of a low-risk pregnancy.  Depression screen [facility] 2/9 05/13/2020 01/29/2020 12/18/2019  Decreased Interest 0 0 0  Down, Depressed, Hopeless 0 0 0  PHQ - 2 Score 0 0 0  Altered sleeping 0 0 -  Tired, decreased energy 2 2 -  Change in appetite 0 1 -  Feeling bad or failure about yourself  0 0 -  Trouble concentrating 0 0 -  Moving slowly or fidgety/restless 0 0 -  Suicidal thoughts 0 0 -  PHQ-9 Score 2 3 -  Difficult doing work/chores Not difficult at all Not difficult at all -    Today she reports just feeling tired, in process of selling their house. Contractions: Not present. Vag. Bleeding: None.  Movement: Present. denies leaking of fluid. Review of Systems:   Pertinent items are noted in HPI Denies abnormal vaginal discharge w/ itching/odor/irritation, headaches, visual changes, shortness of breath, chest pain, abdominal pain, severe nausea/vomiting, or problems with urination or bowel movements unless otherwise stated above. Pertinent History Reviewed:    Reviewed past medical,surgical, social, obstetrical and family history.  Reviewed problem list, medications and allergies. Physical Assessment:   Vitals:   07/05/20 1119  BP: 110/72  There is no height or weight on file to calculate BMI.        Physical Examination:   General:  Alert, oriented and cooperative.   Mental Status: Normal mood and affect perceived. Normal judgment and thought content.  Rest of physical exam deferred due to type of encounter  No results found for this or any previous visit (from the past 24 hour(s)).  Assessment & Plan:  1) Pregnancy [id number] at [redacted]w[redacted]d with an Estimated Date of Delivery: 08/09/20    Meds: No orders of the defined types were placed in this encounter.   Labs/procedures today: none  Plan:  Continue routine obstetrical care.  Has home bp cuff.  Check bp weekly, let us know if >140/90.  Next visit: prefers will be in person for gbs    Reviewed: Preterm labor symptoms and general obstetric precautions including but not limited to vaginal bleeding, contractions, leaking of fluid and fetal movement were reviewed in detail with the patient. The patient was advised to call back or seek an in-person office evaluation/go to [facility] at [facility] for any urgent or concerning symptoms. All questions were answered. Please refer to After Visit Summary for other counseling recommendations.    I provided 15 minutes of non-face-to-face time during this encounter.  Follow-up: Return in about 1 week (around 07/12/2020) for LROB, in  person, CNM.  No orders of the defined types were placed in this encounter.  Cheral Marker CNM, [facility] 07/05/2020 11:33 AM

## 2020-07-09 ENCOUNTER — Telehealth: Payer: Self-pay | Admitting: Obstetrics & Gynecology

## 2020-07-09 MED ORDER — CEPHALEXIN 500 MG PO CAPS: 500.0000 mg | ORAL_CAPSULE | Freq: Three times a day (TID) | ORAL | 0 refills | Status: DC

## 2020-07-13 ENCOUNTER — Other Ambulatory Visit (HOSPITAL_COMMUNITY)
Admission: RE | Admit: 2020-07-13 | Discharge: 2020-07-13 | Disposition: A | Discharge status: Home / Self Care | Payer: BC Managed Care – PPO | Source: Ambulatory Visit | Attending: Obstetrics & Gynecology | Admitting: Obstetrics & Gynecology

## 2020-07-13 ENCOUNTER — Encounter: Payer: Self-pay | Admitting: Women's Health

## 2020-07-13 ENCOUNTER — Other Ambulatory Visit: Payer: Self-pay

## 2020-07-13 ENCOUNTER — Ambulatory Visit (INDEPENDENT_AMBULATORY_CARE_PROVIDER_SITE_OTHER): Payer: BC Managed Care – PPO | Admitting: Women's Health

## 2020-07-13 VITALS — BP 108/67 | HR 74 | Wt 174.0 lb

## 2020-07-13 DIAGNOSIS — Z3480 Encounter for supervision of other normal pregnancy, unspecified trimester: Secondary | ICD-10-CM | POA: Diagnosis not present

## 2020-07-13 DIAGNOSIS — Z3483 Encounter for supervision of other normal pregnancy, third trimester: Secondary | ICD-10-CM

## 2020-07-13 DIAGNOSIS — Z1389 Encounter for screening for other disorder: Secondary | ICD-10-CM | POA: Diagnosis not present

## 2020-07-13 DIAGNOSIS — Z3A36 36 weeks gestation of pregnancy: Secondary | ICD-10-CM | POA: Insufficient documentation

## 2020-07-13 DIAGNOSIS — Z331 Pregnant state, incidental: Secondary | ICD-10-CM

## 2020-07-13 LAB — POCT URINALYSIS DIPSTICK OB
Blood, UA: NEGATIVE
Glucose, UA: NEGATIVE
Ketones, UA: NEGATIVE
Leukocytes, UA: NEGATIVE
Nitrite, UA: NEGATIVE
POC,PROTEIN,UA: NEGATIVE

## 2020-07-13 NOTE — Progress Notes (Signed)
LOW-RISK PREGNANCY VISIT Patient name: Stacy Oneill MRN [id number]  Date of birth: December 01, 1990 Chief Complaint:   Routine Prenatal Visit  History of Present Illness:   Stacy Oneill is a 29 y.o. [phone] female at [redacted]w[redacted]d with an Estimated Date of Delivery: 08/09/20 being seen today for ongoing management of a low-risk pregnancy.  Depression screen [facility] 2/9 05/13/2020 01/29/2020 12/18/2019  Decreased Interest 0 0 0  Down, Depressed, Hopeless 0 0 0  PHQ - 2 Score 0 0 0  Altered sleeping 0 0 -  Tired, decreased energy 2 2 -  Change in appetite 0 1 -  Feeling bad or failure about yourself  0 0 -  Trouble concentrating 0 0 -  Moving slowly or fidgety/restless 0 0 -  Suicidal thoughts 0 0 -  PHQ-9 Score 2 3 -  Difficult doing work/chores Not difficult at all Not difficult at all -    Today she reports no complaints. Contractions: Not present. Vag. Bleeding: None.  Movement: Present. denies leaking of fluid. Review of Systems:   Pertinent items are noted in HPI Denies abnormal vaginal discharge w/ itching/odor/irritation, headaches, visual changes, shortness of breath, chest pain, abdominal pain, severe nausea/vomiting, or problems with urination or bowel movements unless otherwise stated above. Pertinent History Reviewed:  Reviewed past medical,surgical, social, obstetrical and family history.  Reviewed problem list, medications and allergies. Physical Assessment:   Vitals:   07/13/20 0848  BP: 108/67  Pulse: 74  Weight: 174 lb (78.9 kg)  Body mass index is 31.83 kg/m.        Physical Examination:   General appearance: Well appearing, and in no distress  Mental status: Alert, oriented to person, place, and time  Skin: Warm & dry  Cardiovascular: Normal heart rate noted  Respiratory: Normal respiratory effort, no distress  Abdomen: Soft, gravid, nontender  Pelvic: Cervical exam performed  Dilation: 1.5 Effacement (%): 80 Station: -2  Extremities: Edema: Trace  Fetal  Status: Fetal Heart Rate (bpm): 157 Fundal Height: 36 cm Movement: Present Presentation: Vertex  Chaperone: Jobe Marker    Results for orders placed or performed in visit on 07/13/20 (from the past 24 hour(s))  POC Urinalysis Dipstick OB   Collection Time: 07/13/20  8:59 AM  Result Value Ref Range   Color, UA     Clarity, UA     Glucose, UA Negative Negative   Bilirubin, UA     Ketones, UA neg    Spec Grav, UA     Blood, UA neg    pH, UA     POC,PROTEIN,UA Negative Negative, Trace, Small (1+), Moderate (2+), Large (3+), 4+   Urobilinogen, UA     Nitrite, UA neg    Leukocytes, UA Negative Negative   Appearance     Odor      Assessment & Plan:  1) Low-risk pregnancy [id number] at [redacted]w[redacted]d with an Estimated Date of Delivery: 08/09/20    Meds: No orders of the defined types were placed in this encounter.  Labs/procedures today: gbs, gc/ct, sve  Plan:  Continue routine obstetrical care  Next visit: prefers online    Reviewed: Preterm labor symptoms and general obstetric precautions including but not limited to vaginal bleeding, contractions, leaking of fluid and fetal movement were reviewed in detail with the patient.  All questions were answered. Has home bp cuff. Check bp weekly, let us know if >140/90.   Follow-up: Return in about 1 week (around 07/20/2020) for LROB, MyChart Video, CNM.  Orders  Placed This Encounter  Procedures  . Culture, beta strep (group b only)  . POC Urinalysis Dipstick OB   Cheral Marker CNM, [facility] 07/13/2020 9:12 AM

## 2020-07-13 NOTE — Patient Instructions (Signed)
Stacy Oneill, I greatly value your feedback.  If you receive a survey following your visit with us today, we appreciate you taking the time to fill it out.  Thanks, [name], CNM, WHNP-BC  [facility]  ([address] [place], [place] [address]) Entrance C, located off of [address] Free 24/7 valet parking   Go to [email or web address] to register for FREE online childbirth classes    Call the office ([phone]) or go to [facility] if:  You begin to have strong, frequent contractions  Your water breaks.  Sometimes it is a big gush of fluid, sometimes it is just a trickle that keeps getting your panties wet or running down your legs  You have vaginal bleeding.  It is normal to have a small amount of spotting if your cervix was checked.   You don't feel your baby moving like normal.  If you don't, get you something to eat and drink and lay down and focus on feeling your baby move.  You should feel at least 10 movements in 2 hours.  If you don't, you should call the office or go to [facility].   Call the office ([phone]) or go to [facility] for these signs of pre-eclampsia:  Severe headache that does not go away with Tylenol  Visual changes- seeing spots, double, blurred vision  Pain under your right breast or upper abdomen that does not go away with Tums or heartburn medicine  Nausea and/or vomiting  Severe swelling in your hands, feet, and face    Home Blood Pressure Monitoring for Patients   Your provider has recommended that you check your blood pressure (BP) at least once a week at home. If you do not have a blood pressure cuff at home, one will be provided for you. Contact your provider if you have not received your monitor within 1 week.   Helpful Tips for Accurate Home Blood Pressure Checks  . Don't smoke, exercise, or drink caffeine 30 minutes before checking your BP . Use the restroom before checking your BP (a full  bladder can raise your pressure) . Relax in a comfortable upright chair . Feet on the ground . Left arm resting comfortably on a flat surface at the level of your heart . Legs uncrossed . Back supported . Sit quietly and don't talk . Place the cuff on your bare arm . Adjust snuggly, so that only two fingertips can fit between your skin and the top of the cuff . Check 2 readings separated by at least one minute . Keep a log of your BP readings . For a visual, please reference this diagram: http://ccnc.care/bpdiagram  Provider Name: [facility]     Phone: [phone]  Zone 1: ALL CLEAR  Continue to monitor your symptoms:  . BP reading is less than 140 (top number) or less than 90 (bottom number)  . No right upper stomach pain . No headaches or seeing spots . No feeling nauseated or throwing up . No swelling in face and hands  Zone 2: CAUTION Call your doctor's office for any of the following:  . BP reading is greater than 140 (top number) or greater than 90 (bottom number)  . Stomach pain under your ribs in the middle or right side . Headaches or seeing spots . Feeling nauseated or throwing up . Swelling in face and hands  Zone 3: EMERGENCY  Seek immediate medical care if you have any of   the following:  . BP reading is greater than160 (top number) or greater than 110 (bottom number) . Severe headaches not improving with Tylenol . Serious difficulty catching your breath . Any worsening symptoms from Zone 2  Preterm Labor and Birth Information  The normal length of a pregnancy is 39-41 weeks. Preterm labor is when labor starts before 37 completed weeks of pregnancy. What are the risk factors for preterm labor? Preterm labor is more likely to occur in women who:  Have certain infections during pregnancy such as a bladder infection, sexually transmitted infection, or infection inside the uterus (chorioamnionitis).  Have a shorter-than-normal cervix.  Have gone into  preterm labor before.  Have had surgery on their cervix.  Are younger than age 54 or older than age 25.  Are African American.  Are pregnant with twins or multiple babies (multiple gestation).  Take street drugs or smoke while pregnant.  Do not gain enough weight while pregnant.  Became pregnant shortly after having been pregnant. What are the symptoms of preterm labor? Symptoms of preterm labor include:  Cramps similar to those that can happen during a menstrual period. The cramps may happen with diarrhea.  Pain in the abdomen or lower back.  Regular uterine contractions that may feel like tightening of the abdomen.  A feeling of increased pressure in the pelvis.  Increased watery or bloody mucus discharge from the vagina.  Water breaking (ruptured amniotic sac). Why is it important to recognize signs of preterm labor? It is important to recognize signs of preterm labor because babies who are born prematurely may not be fully developed. This can put them at an increased risk for:  Long-term (chronic) heart and lung problems.  Difficulty immediately after birth with regulating body systems, including blood sugar, body temperature, heart rate, and breathing rate.  Bleeding in the brain.  Cerebral palsy.  Learning difficulties.  Death. These risks are highest for babies who are born before 48 weeks of pregnancy. How is preterm labor treated? Treatment depends on the length of your pregnancy, your condition, and the health of your baby. It may involve: 1. Having a stitch (suture) placed in your cervix to prevent your cervix from opening too early (cerclage). 2. Taking or being given medicines, such as: ? Hormone medicines. These may be given early in pregnancy to help support the pregnancy. ? Medicine to stop contractions. ? Medicines to help mature the baby's lungs. These may be prescribed if the risk of delivery is high. ? Medicines to prevent your baby from  developing cerebral palsy. If the labor happens before 34 weeks of pregnancy, you may need to stay in the hospital. What should I do if I think I am in preterm labor? If you think that you are going into preterm labor, call your health care provider right away. How can I prevent preterm labor in future pregnancies? To increase your chance of having a full-term pregnancy:  Do not use any tobacco products, such as cigarettes, chewing tobacco, and e-cigarettes. If you need help quitting, ask your health care provider.  Do not use street drugs or medicines that have not been prescribed to you during your pregnancy.  Talk with your health care provider before taking any herbal supplements, even if you have been taking them regularly.  Make sure you gain a healthy amount of weight during your pregnancy.  Watch for infection. If you think that you might have an infection, get it checked right away.  Make sure to  tell your health care provider if you have gone into preterm labor before. This information is not intended to replace advice given to you by your health care provider. Make sure you discuss any questions you have with your health care provider. Document Revised: 02/14/2019 Document Reviewed: 03/15/2016 Elsevier Patient Education  [place].

## 2020-07-14 LAB — CERVICOVAGINAL ANCILLARY ONLY
Chlamydia: NEGATIVE
Comment: NEGATIVE
Comment: NORMAL
Neisseria Gonorrhea: NEGATIVE

## 2020-07-16 ENCOUNTER — Telehealth: Payer: Self-pay | Admitting: *Deleted

## 2020-07-16 NOTE — Telephone Encounter (Signed)
Patient states additional information is needed for her FMLA.  Forms were attached in [employer].  Informed I have printed those and will get to them on Monday.  Pt verbalized understanding.  Also made aware that mucous plug my come out days or weeks before the baby is born.

## 2020-07-17 LAB — CULTURE, BETA STREP (GROUP B ONLY): Strep Gp B Culture: NEGATIVE

## 2020-07-21 ENCOUNTER — Telehealth (INDEPENDENT_AMBULATORY_CARE_PROVIDER_SITE_OTHER): Payer: BC Managed Care – PPO | Admitting: Advanced Practice Midwife

## 2020-07-21 VITALS — BP 110/69

## 2020-07-21 DIAGNOSIS — Z3483 Encounter for supervision of other normal pregnancy, third trimester: Secondary | ICD-10-CM | POA: Diagnosis not present

## 2020-07-21 DIAGNOSIS — Z3A37 37 weeks gestation of pregnancy: Secondary | ICD-10-CM | POA: Diagnosis not present

## 2020-07-21 NOTE — Patient Instructions (Signed)

## 2020-07-21 NOTE — Progress Notes (Signed)
   OBSTETRICS PRENATAL VIRTUAL VISIT ENCOUNTER NOTE  Provider location: [facility]   I connected with [name] on 07/21/20 at 10:10 AM EDT by MyChart Video Encounter at home and verified that I am speaking with the correct person using two identifiers.   I discussed the limitations, risks, security and privacy concerns of performing an evaluation and management service virtually and the availability of in person appointments. I also discussed with the patient that there may be a patient responsible charge related to this service. The patient expressed understanding and agreed to proceed. Subjective:  Stacy Oneill is a 29 y.o. [phone] at 105w2d being seen today for ongoing prenatal care.  She is currently monitored for the following issues for this low-risk pregnancy and has Short interval between pregnancies affecting pregnancy in first trimester, antepartum; Encounter for supervision of other normal pregnancy, unspecified trimester; Asthma; and Monilial vulvovaginitis on their problem list.  Patient reports no complaints.  Contractions: Irritability. Vag. Bleeding: None.  Movement: Present. Denies any leaking of fluid.   The following portions of the patient's history were reviewed and updated as appropriate: allergies, current medications, past family history, past medical history, past social history, past surgical history and problem list.   Objective:   Vitals:   07/21/20 0943  BP: 110/69    Fetal Status:     Movement: Present     General:  Alert, oriented and cooperative. Patient is in no acute distress.  Respiratory: Normal respiratory effort, no problems with respiration noted  Mental Status: Normal mood and affect. Normal behavior. Normal judgment and thought content.  Rest of physical exam deferred due to type of encounter  Imaging: No results found.  Assessment and Plan:  Pregnancy: G4P3003 at [redacted]w[redacted]d 1. Encounter for  supervision of other normal pregnancy in third trimester - Routine care, GBS neg - Virtual next week per patient preference - Plan for in person at 39 and 40 weeks, offer membrane sweep  2. [redacted] weeks gestation of pregnancy   Term labor symptoms and general obstetric precautions including but not limited to vaginal bleeding, contractions, leaking of fluid and fetal movement were reviewed in detail with the patient. I discussed the assessment and treatment plan with the patient. The patient was provided an opportunity to ask questions and all were answered. The patient agreed with the plan and demonstrated an understanding of the instructions. The patient was advised to call back or seek an in-person office evaluation/go to [facility]U at [facility] for any urgent or concerning symptoms. Please refer to After Visit Summary for other counseling recommendations.   I provided six minutes of face-to-face time during this encounter.  Return in about 1 week (around 07/28/2020).  Calvert Cantor, CNM [facility] [employer], [facility]

## 2020-07-23 ENCOUNTER — Inpatient Hospital Stay (HOSPITAL_COMMUNITY)
Admission: AD | Admit: 2020-07-23 | Discharge: 2020-07-23 | Disposition: A | Discharge status: Home / Self Care | Payer: BC Managed Care – PPO | Attending: Obstetrics and Gynecology | Admitting: Obstetrics and Gynecology

## 2020-07-23 ENCOUNTER — Other Ambulatory Visit: Payer: Self-pay

## 2020-07-23 ENCOUNTER — Encounter (HOSPITAL_COMMUNITY): Payer: Self-pay | Admitting: Obstetrics and Gynecology

## 2020-07-23 DIAGNOSIS — O471 False labor at or after 37 completed weeks of gestation: Secondary | ICD-10-CM

## 2020-07-23 DIAGNOSIS — Z3A36 36 weeks gestation of pregnancy: Secondary | ICD-10-CM | POA: Insufficient documentation

## 2020-07-23 DIAGNOSIS — O4703 False labor before 37 completed weeks of gestation, third trimester: Secondary | ICD-10-CM | POA: Insufficient documentation

## 2020-07-23 DIAGNOSIS — Z3480 Encounter for supervision of other normal pregnancy, unspecified trimester: Secondary | ICD-10-CM

## 2020-07-23 NOTE — Progress Notes (Signed)
Written and verbal d/c instructions given and understanding voiced. Pt to call office this am and schedule her next PNV for next wk as she currently does not have one

## 2020-07-23 NOTE — Discharge Instructions (Signed)
Braxton Hicks Contractions °Contractions of the uterus can occur throughout pregnancy, but they are not always a sign that you are in labor. You may have practice contractions called Braxton Hicks contractions. These false labor contractions are sometimes confused with true labor. °What are Braxton Hicks contractions? °Braxton Hicks contractions are tightening movements that occur in the muscles of the uterus before labor. Unlike true labor contractions, these contractions do not result in opening (dilation) and thinning of the cervix. Toward the end of pregnancy (32-34 weeks), Braxton Hicks contractions can happen more often and may become stronger. These contractions are sometimes difficult to tell apart from true labor because they can be very uncomfortable. You should not feel embarrassed if you go to the hospital with false labor. °Sometimes, the only way to tell if you are in true labor is for your health care provider to look for changes in the cervix. The health care provider will do a physical exam and may monitor your contractions. If you are not in true labor, the exam should show that your cervix is not dilating and your water has not broken. °If there are no other health problems associated with your pregnancy, it is completely safe for you to be sent home with false labor. You may continue to have Braxton Hicks contractions until you go into true labor. °How to tell the difference between true labor and false labor °True labor °· Contractions last 30-70 seconds. °· Contractions become very regular. °· Discomfort is usually felt in the top of the uterus, and it spreads to the lower abdomen and low back. °· Contractions do not go away with walking. °· Contractions usually become more intense and increase in frequency. °· The cervix dilates and gets thinner. °False labor °· Contractions are usually shorter and not as strong as true labor contractions. °· Contractions are usually irregular. °· Contractions  are often felt in the front of the lower abdomen and in the groin. °· Contractions may go away when you walk around or change positions while lying down. °· Contractions get weaker and are shorter-lasting as time goes on. °· The cervix usually does not dilate or become thin. °Follow these instructions at home: ° °· Take over-the-counter and prescription medicines only as told by your health care provider. °· Keep up with your usual exercises and follow other instructions from your health care provider. °· Eat and drink lightly if you think you are going into labor. °· If Braxton Hicks contractions are making you uncomfortable: °? Change your position from lying down or resting to walking, or change from walking to resting. °? Sit and rest in a tub of warm water. °? Drink enough fluid to keep your urine pale yellow. Dehydration may cause these contractions. °? Do slow and deep breathing several times an hour. °· Keep all follow-up prenatal visits as told by your health care provider. This is important. °Contact a health care provider if: °· You have a fever. °· You have continuous pain in your abdomen. °Get help right away if: °· Your contractions become stronger, more regular, and closer together. °· You have fluid leaking or gushing from your vagina. °· You pass blood-tinged mucus (bloody show). °· You have bleeding from your vagina. °· You have low back pain that you never had before. °· You feel your baby’s head pushing down and causing pelvic pressure. °· Your baby is not moving inside you as much as it used to. °Summary °· Contractions that occur before labor are   called Braxton Hicks contractions, false labor, or practice contractions. °· Braxton Hicks contractions are usually shorter, weaker, farther apart, and less regular than true labor contractions. True labor contractions usually become progressively stronger and regular, and they become more frequent. °· Manage discomfort from Braxton Hicks contractions  by changing position, resting in a warm bath, drinking plenty of water, or practicing deep breathing. °This information is not intended to replace advice given to you by your health care provider. Make sure you discuss any questions you have with your health care provider. °Document Revised: 10/05/2017 Document Reviewed: 03/08/2017 °Elsevier Patient Education © 2020 Elsevier Inc. ° °

## 2020-07-23 NOTE — MAU Note (Signed)
Ctxs since 0200. Denies LOF or VB. Was 1.5cm and 80% at 36wks

## 2020-07-23 NOTE — Progress Notes (Signed)
Discussed patient with RN. SVE unchanged since last check at 36 weeks, at 1.5cm/80%. Contractions about every 6 minutes. FHT reactive, Cat I. False labor. Will d/c home with strict return precautions.

## 2020-07-23 NOTE — Progress Notes (Signed)
Dr Melba Coon notified of pt's admission and status.. Aware of ctx pattern, sve, reactive FHR tracing. FHR tracing reviewed. Pt stable for d/c home.

## 2020-07-27 ENCOUNTER — Ambulatory Visit (INDEPENDENT_AMBULATORY_CARE_PROVIDER_SITE_OTHER): Payer: BC Managed Care – PPO | Admitting: Women's Health

## 2020-07-27 ENCOUNTER — Encounter: Payer: Self-pay | Admitting: Women's Health

## 2020-07-27 VITALS — BP 114/69 | HR 76 | Wt 171.0 lb

## 2020-07-27 DIAGNOSIS — Z1389 Encounter for screening for other disorder: Secondary | ICD-10-CM

## 2020-07-27 DIAGNOSIS — Z331 Pregnant state, incidental: Secondary | ICD-10-CM

## 2020-07-27 DIAGNOSIS — Z3483 Encounter for supervision of other normal pregnancy, third trimester: Secondary | ICD-10-CM

## 2020-07-27 DIAGNOSIS — Z3A38 38 weeks gestation of pregnancy: Secondary | ICD-10-CM

## 2020-07-27 NOTE — Progress Notes (Signed)
° °  LOW-RISK PREGNANCY VISIT Patient name: Stacy Oneill MRN [id number]  Date of birth: 1991/10/30 Chief Complaint:   Routine Prenatal Visit  History of Present Illness:   Stacy Oneill is a 29 y.o. [phone] female at [redacted]w[redacted]d with an Estimated Date of Delivery: 08/09/20 being seen today for ongoing management of a low-risk pregnancy.  Depression screen [facility] 2/9 05/13/2020 01/29/2020 12/18/2019  Decreased Interest 0 0 0  Down, Depressed, Hopeless 0 0 0  PHQ - 2 Score 0 0 0  Altered sleeping 0 0 -  Tired, decreased energy 2 2 -  Change in appetite 0 1 -  Feeling bad or failure about yourself  0 0 -  Trouble concentrating 0 0 -  Moving slowly or fidgety/restless 0 0 -  Suicidal thoughts 0 0 -  PHQ-9 Score 2 3 -  Difficult doing work/chores Not difficult at all Not difficult at all -    Today she reports leaking fluid since Sunday when coughing/sneezing, etc. No abnormal d/c, itching/odor/irritation. No contractions. Went to MAU Friday for r/o labor, was 2cm. Contractions: Irritability. Vag. Bleeding: None.  Movement: Present. reports leaking of fluid. Review of Systems:   Pertinent items are noted in HPI Denies abnormal vaginal discharge w/ itching/odor/irritation, headaches, visual changes, shortness of breath, chest pain, abdominal pain, severe nausea/vomiting, or problems with urination or bowel movements unless otherwise stated above. Pertinent History Reviewed:  Reviewed past medical,surgical, social, obstetrical and family history.  Reviewed problem list, medications and allergies. Physical Assessment:   Vitals:   07/27/20 0913  BP: 114/69  Pulse: 76  Weight: 171 lb (77.6 kg)  Body mass index is 31.28 kg/m.        Physical Examination:   General appearance: Well appearing, and in no distress  Mental status: Alert, oriented to person, place, and time  Skin: Warm & dry  Cardiovascular: Normal heart rate noted  Respiratory: Normal respiratory effort, no distress  Abdomen:  Soft, gravid, nontender  Pelvic: SSE: cx visually closed, large amt thick clumpy d/c c/w yeast, no pooling, no change w/ valsalva, fern and nitrazine neg         Extremities: Edema: Trace  Fetal Status: Fetal Heart Rate (bpm): 117 Fundal Height: 37 cm Movement: Present    Chaperone: Amanda Rash    No results found for this or any previous visit (from the past 24 hour(s)).  Assessment & Plan:  1) Low-risk pregnancy G4P3003 at [redacted]w[redacted]d with an Estimated Date of Delivery: 08/09/20   2) Yeast, monistat 7   Meds: No orders of the defined types were placed in this encounter.  Labs/procedures today: spec exam, fern  Plan:  Continue routine obstetrical care  Next visit: prefers in person    Reviewed: Term labor symptoms and general obstetric precautions including but not limited to vaginal bleeding, contractions, leaking of fluid and fetal movement were reviewed in detail with the patient.  All questions were answered. Has home bp cuff. Check bp weekly, let us know if >140/90.   Follow-up: Return in about 1 week (around 08/03/2020) for LROB, CNM, in person.  Orders Placed This Encounter  Procedures   POC Urinalysis Dipstick OB   Cheral Marker CNM, [facility] 07/27/2020 9:32 AM

## 2020-07-27 NOTE — Patient Instructions (Signed)
Stacy Oneill, I greatly value your feedback.  If you receive a survey following your visit with [place] today, we appreciate you taking the time to fill it out.  Thanks, [name], CNM, WHNP-BC  [facility] ([address] [place], [place] [address]) Entrance C, located off of [address] valet parking   Go to [email or web address] to register for FREE online childbirth classes    Call the office [phone]) or go to [facility] if:  You begin to have strong, frequent contractions  Your water breaks.  Sometimes it is a big gush of fluid, sometimes it is just a trickle that keeps getting your panties wet or running down your legs  You have vaginal bleeding.  It is normal to have a small amount of spotting if your cervix was checked.   You don't feel your baby moving like normal.  If you don't, get you something to eat and drink and lay down and focus on feeling your baby move.  You should feel at least 10 movements in 2 hours.  If you don't, you should call the office or go to [facility].   Call the office [phone]) or go to [facility] hospital for these signs of pre-eclampsia:  Severe headache that does not go away with Tylenol  Visual changes- seeing spots, double, blurred vision  Pain under your right breast or upper abdomen that does not go away with Tums or heartburn medicine  Nausea and/or vomiting  Severe swelling in your hands, feet, and face    Home Blood Pressure Monitoring for Patients   Your provider has recommended that you check your blood pressure (BP) at least once a week at home. If you do not have a blood pressure cuff at home, one will be provided for you. Contact your provider if you have not received your monitor within 1 week.   Helpful Tips for Accurate Home Blood Pressure Checks  . Don't smoke, exercise, or drink caffeine 30 minutes before checking your BP . Use the restroom before checking your BP (a full  bladder can raise your pressure) . Relax in a comfortable upright chair . Feet on the ground . Left arm resting comfortably on a flat surface at the level of your heart . Legs uncrossed . Back supported . Sit quietly and don't talk . Place the cuff on your bare arm . Adjust snuggly, so that only two fingertips can fit between your skin and the top of the cuff . Check 2 readings separated by at least one minute . Keep a log of your BP readings . For a visual, please reference this diagram: http://ccnc.care/bpdiagram  Provider Name: [facility]     Phone: [phone]  Zone 1: ALL CLEAR  Continue to monitor your symptoms:  . BP reading is less than 140 (top number) or less than 90 (bottom number)  . No right upper stomach pain . No headaches or seeing spots . No feeling nauseated or throwing up . No swelling in face and hands  Zone 2: CAUTION Call your doctor's office for any of the following:  . BP reading is greater than 140 (top number) or greater than 90 (bottom number)  . Stomach pain under your ribs in the middle or right side . Headaches or seeing spots . Feeling nauseated or throwing up . Swelling in face and hands  Zone 3: EMERGENCY  Seek immediate medical care if you have any of  the following:  . BP reading is greater than160 (top number) or greater than 110 (bottom number) . Severe headaches not improving with Tylenol . Serious difficulty catching your breath . Any worsening symptoms from Zone 2   Braxton Hicks Contractions Contractions of the uterus can occur throughout pregnancy, but they are not always a sign that you are in labor. You may have practice contractions called Braxton Hicks contractions. These false labor contractions are sometimes confused with true labor. What are Braxton Hicks contractions? Braxton Hicks contractions are tightening movements that occur in the muscles of the uterus before labor. Unlike true labor contractions, these  contractions do not result in opening (dilation) and thinning of the cervix. Toward the end of pregnancy (32-34 weeks), Braxton Hicks contractions can happen more often and may become stronger. These contractions are sometimes difficult to tell apart from true labor because they can be very uncomfortable. You should not feel embarrassed if you go to the hospital with false labor. Sometimes, the only way to tell if you are in true labor is for your health care provider to look for changes in the cervix. The health care provider will do a physical exam and may monitor your contractions. If you are not in true labor, the exam should show that your cervix is not dilating and your water has not broken. If there are no other health problems associated with your pregnancy, it is completely safe for you to be sent home with false labor. You may continue to have Braxton Hicks contractions until you go into true labor. How to tell the difference between true labor and false labor True labor  Contractions last 30-70 seconds.  Contractions become very regular.  Discomfort is usually felt in the top of the uterus, and it spreads to the lower abdomen and low back.  Contractions do not go away with walking.  Contractions usually become more intense and increase in frequency.  The cervix dilates and gets thinner. False labor  Contractions are usually shorter and not as strong as true labor contractions.  Contractions are usually irregular.  Contractions are often felt in the front of the lower abdomen and in the groin.  Contractions may go away when you walk around or change positions while lying down.  Contractions get weaker and are shorter-lasting as time goes on.  The cervix usually does not dilate or become thin. Follow these instructions at home:  1. Take over-the-counter and prescription medicines only as told by your health care provider. 2. Keep up with your usual exercises and follow other  instructions from your health care provider. 3. Eat and drink lightly if you think you are going into labor. 4. If Braxton Hicks contractions are making you uncomfortable: ? Change your position from lying down or resting to walking, or change from walking to resting. ? Sit and rest in a tub of warm water. ? Drink enough fluid to keep your urine pale yellow. Dehydration may cause these contractions. ? Do slow and deep breathing several times an hour. 5. Keep all follow-up prenatal visits as told by your health care provider. This is important. Contact a health care provider if:  You have a fever.  You have continuous pain in your abdomen. Get help right away if:  Your contractions become stronger, more regular, and closer together.  You have fluid leaking or gushing from your vagina.  You pass blood-tinged mucus (bloody show).  You have bleeding from your vagina.  You have low back   pain that you never had before.  You feel your baby's head pushing down and causing pelvic pressure.  Your baby is not moving inside you as much as it used to. Summary  Contractions that occur before labor are called Braxton Hicks contractions, false labor, or practice contractions.  Braxton Hicks contractions are usually shorter, weaker, farther apart, and less regular than true labor contractions. True labor contractions usually become progressively stronger and regular, and they become more frequent.  Manage discomfort from Braxton Hicks contractions by changing position, resting in a warm bath, drinking plenty of water, or practicing deep breathing. This information is not intended to replace advice given to you by your health care provider. Make sure you discuss any questions you have with your health care provider. Document Revised: 10/05/2017 Document Reviewed: 03/08/2017 Elsevier Patient Education  2020 [employer].    

## 2020-07-29 DIAGNOSIS — O9952 Diseases of the respiratory system complicating childbirth: Secondary | ICD-10-CM | POA: Diagnosis not present

## 2020-07-29 DIAGNOSIS — Z20822 Contact with and (suspected) exposure to covid-19: Secondary | ICD-10-CM | POA: Diagnosis not present

## 2020-07-29 DIAGNOSIS — O99513 Diseases of the respiratory system complicating pregnancy, third trimester: Secondary | ICD-10-CM | POA: Diagnosis not present

## 2020-07-29 DIAGNOSIS — J45909 Unspecified asthma, uncomplicated: Secondary | ICD-10-CM | POA: Diagnosis not present

## 2020-07-29 DIAGNOSIS — Z3A38 38 weeks gestation of pregnancy: Secondary | ICD-10-CM | POA: Diagnosis not present

## 2020-08-04 ENCOUNTER — Encounter: Payer: BC Managed Care – PPO | Admitting: Advanced Practice Midwife

## 2020-09-01 ENCOUNTER — Encounter: Payer: Self-pay | Admitting: *Deleted

## 2020-09-02 ENCOUNTER — Other Ambulatory Visit: Payer: Self-pay

## 2020-09-02 ENCOUNTER — Ambulatory Visit (INDEPENDENT_AMBULATORY_CARE_PROVIDER_SITE_OTHER): Payer: BC Managed Care – PPO | Admitting: Advanced Practice Midwife

## 2020-09-02 VITALS — BP 119/74 | HR 81 | Ht 62.0 in | Wt 153.0 lb

## 2020-09-02 DIAGNOSIS — Z30430 Encounter for insertion of intrauterine contraceptive device: Secondary | ICD-10-CM | POA: Diagnosis not present

## 2020-09-02 DIAGNOSIS — Z01419 Encounter for gynecological examination (general) (routine) without abnormal findings: Secondary | ICD-10-CM

## 2020-09-02 DIAGNOSIS — Z3202 Encounter for pregnancy test, result negative: Secondary | ICD-10-CM

## 2020-09-02 LAB — POCT URINE PREGNANCY: Preg Test, Ur: NEGATIVE

## 2020-09-02 MED ORDER — LEVONORGESTREL 19.5 MCG/DAY IU IUD: INTRAUTERINE_SYSTEM | Freq: Once | INTRAUTERINE | Status: AC

## 2020-09-02 NOTE — Addendum Note (Signed)
Addended by: Leilani Able, Vanessa Kampf A on: 09/02/2020 12:23 PM   Modules accepted: Orders

## 2020-09-02 NOTE — Progress Notes (Signed)
Post Partum Visit Note  Stacy Oneill is a 29 y.o. [phone] female who presents for a postpartum visit. She is 6 weeks postpartum following a normal spontaneous vaginal delivery.  I have fully reviewed the prenatal and intrapartum course. The delivery was at 38 gestational weeks.  Anesthesia: epidural. Postpartum course has been uneventful. Baby is doing well. Baby is feeding by bottle - Similac Advance. Bleeding staining only. Bowel function is normal. Bladder function is normal. Patient is not sexually active. Contraception method is abstinence. Postpartum depression screening: negative.      The risks and benefits of the method and placement have been thouroughly reviewed with the patient and all questions were answered.  Specifically the patient is aware of failure rate of 11/998, expulsion of the IUD and of possible perforation.  The patient is aware of irregular bleeding due to the method and understands the incidence of irregular bleeding diminishes with time.  Time out was performed.  A Graves speculum was placed.  The cervix was prepped using Betadine. The uterus was found to be neutral and it sounded to 8 cm.  The cervix was grasped with a tenaculum and the IUD was inserted to 8 cm.  It was pulled back 1 cm and the IUD was disengaged.  The strings were trimmed to 3 cm.  Sonogram was performed and the proper placement of the IUD was verified.  The patient was instructed on signs and symptoms of infection and to check for the strings after each menses or each month.  The patient is to refrain from intercourse for 3 days.   Review of Systems Pertinent items are noted in HPI.   Objective:  Blood pressure 119/74, pulse 81, height 5\' 2"  (1.575 m), weight 153 lb (69.4 kg), not currently breastfeeding.  General:  alert, cooperative, and no distress   Breasts:  negative  Lungs: Normal respiratory effort  Heart:  regular rate and rhythm  Abdomen: soft, non-tender,    Vulva:   normal  Vagina: normal vagina  Cervix:  normal  Corpus: Well involuted  Adnexa:  not evaluated  Rectal Exam: no hemorrhoids        Assessment:    normal postpartum exam. Pap smear not done at today's visit.    IUD insertion  Plan:   Essential components of care per ACOG recommendations:  1.  Mood and well being: Patient with negative depression screening today. Reviewed local resources for support.  - Patient does use tobacco. If using tobacco we discussed reduction and for recently cessation risk of relapse - hx of drug use? No   If yes, discussed support systems  2. Infant care and feeding:  -Patient currently breastmilk feeding? Yes If breastmilk feeding discussed return to work and pumping. If needed, patient was provided letter for work to allow for every 2-3 hr pumping breaks, and to be granted a private location to express breastmilk and refrigerated area to store breastmilk. Reviewed importance of draining breast regularly to support lactation. -Social determinants of health (SDOH) reviewed in EPIC. No concernsThe following needs were identified none  3. Sexuality, contraception and birth spacing - Patient does not want a pregnancy in the next year.  Desired family size is 2 children.  - Reviewed forms of contraception in tiered fashion. Patient desired IUD today.   - Discussed birth spacing of 18 months  4. Sleep and fatigue -Encouraged family/partner/community support of 4 hrs of uninterrupted sleep to help with mood and fatigue  5. Physical Recovery  -  Discussed patients delivery and complications - Patient had a 1 degree laceration, perineal healing reviewed. Patient expressed understanding - Patient has urinary incontinence? No Patient was referred to pelvic floor PT  - Patient is safe to resume physical and sexual activity  6.  Health Maintenance - Last pap smear done 06/22/20 and was normal with negative HPV.  7. Asthma: Chronic Disease - PCP follow  up  Jacklyn Shell, CNM [facility] [employer], [facility]

## 2020-09-02 NOTE — Patient Instructions (Signed)
IUD PLACEMENT POST-PROCEDURE INSTRUCTIONS  1. You may take Ibuprofen, Aleve or Tylenol for pain if needed.  Cramping should resolve within in 24 hours.  2. You may have a small amount of spotting.  You should wear a mini pad for the next few days  3.    Nothing in the vagina for 3 days (tampons or intercourse)  4.  You need to call if you have a fever (more than 100.4), any moderate to severe pelvic pain, fever, or foul smelling vaginal discharge.  Irregular bleeding is common the first several months after having an IUD placed. You do not need to call for this reason unless you are concerned.  5. Shower or bathe as normal  6.  You should have a follow-up appointment in 4-8 weeks for a re-check to make sure you are not having any problems.  

## 2020-09-16 ENCOUNTER — Encounter: Payer: Self-pay | Admitting: *Deleted

## 2020-10-04 ENCOUNTER — Ambulatory Visit: Payer: BC Managed Care – PPO | Admitting: Advanced Practice Midwife

## 2021-02-10 ENCOUNTER — Other Ambulatory Visit: Payer: Self-pay | Admitting: Advanced Practice Midwife

## 2021-02-10 MED ORDER — BUDESONIDE-FORMOTEROL FUMARATE 160-4.5 MCG/ACT IN AERO: 2.0000 | INHALATION_SPRAY | Freq: Two times a day (BID) | RESPIRATORY_TRACT | 1 refills | Status: DC | PRN

## 2021-02-10 MED ORDER — FLUCONAZOLE 150 MG PO TABS: ORAL_TABLET | ORAL | 2 refills | Status: AC

## 2021-02-10 MED ORDER — ALBUTEROL SULFATE HFA 108 (90 BASE) MCG/ACT IN AERS: 2.0000 | INHALATION_SPRAY | Freq: Four times a day (QID) | RESPIRATORY_TRACT | 1 refills | Status: DC | PRN

## 2021-03-31 ENCOUNTER — Other Ambulatory Visit: Payer: Self-pay | Admitting: Advanced Practice Midwife

## 2021-05-25 ENCOUNTER — Other Ambulatory Visit: Payer: Self-pay | Admitting: Advanced Practice Midwife

## 2021-09-18 ENCOUNTER — Other Ambulatory Visit: Payer: Self-pay | Admitting: Advanced Practice Midwife

## 2021-11-14 ENCOUNTER — Other Ambulatory Visit: Payer: Self-pay | Admitting: Advanced Practice Midwife

## 2022-01-10 ENCOUNTER — Other Ambulatory Visit: Payer: Self-pay | Admitting: Advanced Practice Midwife

## 2022-03-07 ENCOUNTER — Other Ambulatory Visit: Payer: Self-pay | Admitting: Advanced Practice Midwife

## 2022-04-29 ENCOUNTER — Other Ambulatory Visit: Payer: Self-pay | Admitting: Advanced Practice Midwife

## 2022-05-04 ENCOUNTER — Other Ambulatory Visit: Payer: Self-pay | Admitting: Advanced Practice Midwife

## 2022-05-04 ENCOUNTER — Encounter: Payer: Self-pay | Admitting: Advanced Practice Midwife

## 2022-05-04 MED ORDER — BUDESONIDE-FORMOTEROL FUMARATE 160-4.5 MCG/ACT IN AERO: 2.0000 | INHALATION_SPRAY | Freq: Two times a day (BID) | RESPIRATORY_TRACT | 1 refills | Status: DC | PRN

## 2022-05-04 NOTE — Telephone Encounter (Signed)
Last refill-note sent to see PCP for further refills

## 2022-06-03 ENCOUNTER — Other Ambulatory Visit: Payer: Self-pay | Admitting: Advanced Practice Midwife

## 2022-09-17 ENCOUNTER — Other Ambulatory Visit: Payer: Self-pay | Admitting: Advanced Practice Midwife

## 2023-03-15 ENCOUNTER — Other Ambulatory Visit: Payer: Self-pay | Admitting: Advanced Practice Midwife

## 2023-09-22 ENCOUNTER — Other Ambulatory Visit: Payer: Self-pay | Admitting: Advanced Practice Midwife

## 2023-09-27 ENCOUNTER — Other Ambulatory Visit: Payer: Self-pay | Admitting: Advanced Practice Midwife

## 2023-09-27 MED ORDER — ALBUTEROL SULFATE HFA 108 (90 BASE) MCG/ACT IN AERS: 1.0000 | INHALATION_SPRAY | Freq: Four times a day (QID) | RESPIRATORY_TRACT | 3 refills | Status: AC | PRN

## 2024-05-15 ENCOUNTER — Other Ambulatory Visit: Payer: Self-pay | Admitting: Advanced Practice Midwife
